# Patient Record
Sex: Female | Born: 1945 | Race: White | Hispanic: No | Marital: Married | Smoking: Never smoker
Health system: Southern US, Community
[De-identification: ages and names within clinical notes are randomized; demographics above are authoritative.]

## PROBLEM LIST (undated history)

## (undated) DIAGNOSIS — IMO0001 Reserved for inherently not codable concepts without codable children: Secondary | ICD-10-CM

## (undated) DIAGNOSIS — I10 Essential (primary) hypertension: Secondary | ICD-10-CM

## (undated) DIAGNOSIS — R159 Full incontinence of feces: Secondary | ICD-10-CM

## (undated) DIAGNOSIS — E119 Type 2 diabetes mellitus without complications: Secondary | ICD-10-CM

## (undated) DIAGNOSIS — N289 Disorder of kidney and ureter, unspecified: Secondary | ICD-10-CM

## (undated) DIAGNOSIS — F32A Depression, unspecified: Secondary | ICD-10-CM

## (undated) DIAGNOSIS — E669 Obesity, unspecified: Secondary | ICD-10-CM

## (undated) DIAGNOSIS — K7581 Nonalcoholic steatohepatitis (NASH): Secondary | ICD-10-CM

## (undated) DIAGNOSIS — F329 Major depressive disorder, single episode, unspecified: Secondary | ICD-10-CM

## (undated) DIAGNOSIS — N952 Postmenopausal atrophic vaginitis: Secondary | ICD-10-CM

## (undated) DIAGNOSIS — E785 Hyperlipidemia, unspecified: Secondary | ICD-10-CM

## (undated) DIAGNOSIS — R3129 Other microscopic hematuria: Secondary | ICD-10-CM

## (undated) DIAGNOSIS — A498 Other bacterial infections of unspecified site: Secondary | ICD-10-CM

## (undated) DIAGNOSIS — R32 Unspecified urinary incontinence: Secondary | ICD-10-CM

## (undated) DIAGNOSIS — F250 Schizoaffective disorder, bipolar type: Secondary | ICD-10-CM

## (undated) DIAGNOSIS — R102 Pelvic and perineal pain: Secondary | ICD-10-CM

## (undated) DIAGNOSIS — F419 Anxiety disorder, unspecified: Secondary | ICD-10-CM

## (undated) DIAGNOSIS — B379 Candidiasis, unspecified: Secondary | ICD-10-CM

## (undated) DIAGNOSIS — K219 Gastro-esophageal reflux disease without esophagitis: Secondary | ICD-10-CM

## (undated) HISTORY — DX: Other microscopic hematuria: R31.29

## (undated) HISTORY — DX: Depression, unspecified: F32.A

## (undated) HISTORY — DX: Schizoaffective disorder, bipolar type: F25.0

## (undated) HISTORY — DX: Nonalcoholic steatohepatitis (NASH): K75.81

## (undated) HISTORY — PX: TOTAL ABDOMINAL HYSTERECTOMY: SHX209

## (undated) HISTORY — DX: Full incontinence of feces: R15.9

## (undated) HISTORY — DX: Gastro-esophageal reflux disease without esophagitis: K21.9

## (undated) HISTORY — DX: Hyperlipidemia, unspecified: E78.5

## (undated) HISTORY — DX: Reserved for inherently not codable concepts without codable children: IMO0001

## (undated) HISTORY — DX: Postmenopausal atrophic vaginitis: N95.2

## (undated) HISTORY — DX: Type 2 diabetes mellitus without complications: E11.9

## (undated) HISTORY — DX: Pelvic and perineal pain: R10.2

## (undated) HISTORY — DX: Major depressive disorder, single episode, unspecified: F32.9

## (undated) HISTORY — DX: Disorder of kidney and ureter, unspecified: N28.9

## (undated) HISTORY — DX: Candidiasis, unspecified: B37.9

## (undated) HISTORY — DX: Other bacterial infections of unspecified site: A49.8

## (undated) HISTORY — PX: INCONTINENCE SURGERY: SHX676

## (undated) HISTORY — PX: CHOLECYSTECTOMY: SHX55

## (undated) HISTORY — DX: Essential (primary) hypertension: I10

## (undated) HISTORY — DX: Anxiety disorder, unspecified: F41.9

## (undated) HISTORY — DX: Obesity, unspecified: E66.9

## (undated) HISTORY — DX: Unspecified urinary incontinence: R32

## (undated) HISTORY — PX: EYE SURGERY: SHX253

---

## 2001-03-11 HISTORY — PX: COLECTOMY: SHX59

## 2011-03-14 LAB — HM COLONOSCOPY: HM Colonoscopy: NORMAL

## 2012-05-03 ENCOUNTER — Observation Stay: Payer: Self-pay | Admitting: Internal Medicine

## 2012-05-03 LAB — CK TOTAL AND CKMB (NOT AT ARMC)
CK, Total: 61 U/L (ref 21–215)
CK-MB: <0.5 ng/mL — ABNORMAL LOW (ref 0.5–3.6)

## 2012-05-03 LAB — APTT: Activated PTT: 31.5 secs (ref 23.6–35.9)

## 2012-05-03 LAB — PROTIME-INR
INR: 0.9
Prothrombin Time: 12.2 secs (ref 11.5–14.7)

## 2012-05-03 LAB — COMPREHENSIVE METABOLIC PANEL
Albumin: 3.7 g/dL (ref 3.4–5.0)
Alkaline Phosphatase: 67 U/L (ref 50–136)
Anion Gap: 5 — ABNORMAL LOW (ref 7–16)
BUN: 12 mg/dL (ref 7–18)
Bilirubin,Total: 0.3 mg/dL (ref 0.2–1.0)
Creatinine: 1.64 mg/dL — ABNORMAL HIGH (ref 0.60–1.30)
EGFR (African American): 38 — ABNORMAL LOW
EGFR (Non-African Amer.): 33 — ABNORMAL LOW
Osmolality: 275 (ref 275–301)
SGOT(AST): 20 U/L (ref 15–37)
SGPT (ALT): 17 U/L (ref 12–78)
Sodium: 137 mmol/L (ref 136–145)

## 2012-05-03 LAB — CBC
HCT: 37.6 % (ref 35.0–47.0)
MCHC: 33.6 g/dL (ref 32.0–36.0)
RDW: 13.6 % (ref 11.5–14.5)
WBC: 9.2 10*3/uL (ref 3.6–11.0)

## 2012-05-03 LAB — URINALYSIS, COMPLETE
Bilirubin,UR: NEGATIVE
Glucose,UR: NEGATIVE mg/dL (ref 0–75)
Ketone: NEGATIVE
Nitrite: NEGATIVE
Ph: 5 (ref 4.5–8.0)
Protein: NEGATIVE
RBC,UR: 5 /HPF (ref 0–5)
Specific Gravity: 1.015 (ref 1.003–1.030)
WBC UR: 5 /HPF (ref 0–5)

## 2012-05-04 LAB — BASIC METABOLIC PANEL
Anion Gap: 5 — ABNORMAL LOW (ref 7–16)
BUN: 9 mg/dL (ref 7–18)
Co2: 25 mmol/L (ref 21–32)
Creatinine: 1.45 mg/dL — ABNORMAL HIGH (ref 0.60–1.30)
EGFR (African American): 44 — ABNORMAL LOW
Glucose: 74 mg/dL (ref 65–99)
Osmolality: 277 (ref 275–301)
Potassium: 4.4 mmol/L (ref 3.5–5.1)
Sodium: 140 mmol/L (ref 136–145)

## 2012-05-04 LAB — CBC WITH DIFFERENTIAL/PLATELET
Basophil #: 0 10*3/uL (ref 0.0–0.1)
Basophil %: 0.3 %
Eosinophil %: 1.4 %
HGB: 10.8 g/dL — ABNORMAL LOW (ref 12.0–16.0)
Lymphocyte #: 1.5 10*3/uL (ref 1.0–3.6)
Neutrophil #: 2.9 10*3/uL (ref 1.4–6.5)
Neutrophil %: 60.6 %
Platelet: 99 10*3/uL — ABNORMAL LOW (ref 150–440)
RBC: 3.77 10*6/uL — ABNORMAL LOW (ref 3.80–5.20)
RDW: 13.3 % (ref 11.5–14.5)
WBC: 4.8 10*3/uL (ref 3.6–11.0)

## 2012-05-04 LAB — CK TOTAL AND CKMB (NOT AT ARMC)
CK, Total: 81 U/L (ref 21–215)
CK-MB: <0.5 ng/mL — ABNORMAL LOW (ref 0.5–3.6)

## 2012-05-04 LAB — TROPONIN I: Troponin-I: <0.02 ng/mL

## 2012-05-09 ENCOUNTER — Ambulatory Visit: Payer: Self-pay | Admitting: Hematology and Oncology

## 2012-05-22 LAB — HM MAMMOGRAPHY: HM Mammogram: NORMAL

## 2012-05-25 LAB — CBC CANCER CENTER
Basophil #: 0 x10 3/mm (ref 0.0–0.1)
Eosinophil #: 0 x10 3/mm (ref 0.0–0.7)
Eosinophil %: 0.6 %
HCT: 33.8 % — ABNORMAL LOW (ref 35.0–47.0)
HGB: 11 g/dL — ABNORMAL LOW (ref 12.0–16.0)
Lymphocyte #: 0.9 x10 3/mm — ABNORMAL LOW (ref 1.0–3.6)
MCH: 28.5 pg (ref 26.0–34.0)
MCV: 88 fL (ref 80–100)
Monocyte #: 0.2 x10 3/mm (ref 0.2–0.9)
Neutrophil %: 70.5 %
Platelet: 114 x10 3/mm — ABNORMAL LOW (ref 150–440)

## 2012-05-26 LAB — FERRITIN: Ferritin (ARMC): 11 ng/mL (ref 8–388)

## 2012-05-26 LAB — IRON AND TIBC
Iron Saturation: 10 %
Iron: 49 ug/dL — ABNORMAL LOW (ref 50–170)
Unbound Iron-Bind.Cap.: 432 ug/dL

## 2012-05-26 LAB — TSH: Thyroid Stimulating Horm: 4.83 u[IU]/mL — ABNORMAL HIGH

## 2012-06-08 LAB — CBC CANCER CENTER
Basophil #: 0 x10 3/mm (ref 0.0–0.1)
Eosinophil #: 0.1 x10 3/mm (ref 0.0–0.7)
HGB: 11.6 g/dL — ABNORMAL LOW (ref 12.0–16.0)
MCH: 29.1 pg (ref 26.0–34.0)
MCHC: 32.6 g/dL (ref 32.0–36.0)
MCV: 89 fL (ref 80–100)
Neutrophil %: 71.3 %
RBC: 3.99 10*6/uL (ref 3.80–5.20)
RDW: 13.9 % (ref 11.5–14.5)
WBC: 4 x10 3/mm (ref 3.6–11.0)

## 2012-06-08 LAB — IRON AND TIBC
Iron Bind.Cap.(Total): 453 ug/dL — ABNORMAL HIGH (ref 250–450)
Iron Saturation: 92 %
Iron: 417 ug/dL — ABNORMAL HIGH (ref 50–170)

## 2012-06-09 ENCOUNTER — Ambulatory Visit: Payer: Self-pay | Admitting: Hematology and Oncology

## 2012-06-11 LAB — CBC CANCER CENTER
Basophil %: 0.5 %
Eosinophil #: 0.1 x10 3/mm (ref 0.0–0.7)
Lymphocyte #: 0.9 x10 3/mm — ABNORMAL LOW (ref 1.0–3.6)
Lymphocyte %: 14.3 %
MCH: 29.5 pg (ref 26.0–34.0)
MCHC: 33.2 g/dL (ref 32.0–36.0)
Monocyte #: 0.3 x10 3/mm (ref 0.2–0.9)
Monocyte %: 5.6 %
Neutrophil #: 4.9 x10 3/mm (ref 1.4–6.5)
RBC: 3.96 10*6/uL (ref 3.80–5.20)
RDW: 14.5 % (ref 11.5–14.5)

## 2012-06-22 LAB — CBC CANCER CENTER
Basophil %: 0.8 %
HGB: 11.6 g/dL — ABNORMAL LOW (ref 12.0–16.0)
MCH: 29.2 pg (ref 26.0–34.0)
MCHC: 32.3 g/dL (ref 32.0–36.0)
Neutrophil %: 68.1 %
Platelet: 90 x10 3/mm — ABNORMAL LOW (ref 150–440)
RBC: 3.98 10*6/uL (ref 3.80–5.20)
RDW: 16 % — ABNORMAL HIGH (ref 11.5–14.5)

## 2012-06-22 LAB — BASIC METABOLIC PANEL
Anion Gap: 7 (ref 7–16)
Chloride: 104 mmol/L (ref 98–107)
Co2: 29 mmol/L (ref 21–32)
EGFR (African American): 41 — ABNORMAL LOW
Glucose: 96 mg/dL (ref 65–99)
Osmolality: 281 (ref 275–301)
Potassium: 4.5 mmol/L (ref 3.5–5.1)

## 2012-06-25 DIAGNOSIS — F319 Bipolar disorder, unspecified: Secondary | ICD-10-CM | POA: Insufficient documentation

## 2012-07-09 ENCOUNTER — Ambulatory Visit: Payer: Self-pay | Admitting: Hematology and Oncology

## 2012-08-24 ENCOUNTER — Ambulatory Visit: Payer: Self-pay | Admitting: Hematology and Oncology

## 2012-08-31 ENCOUNTER — Ambulatory Visit: Payer: Self-pay | Admitting: Hematology and Oncology

## 2012-08-31 LAB — CBC CANCER CENTER
Basophil #: 0.1 x10 3/mm (ref 0.0–0.1)
Basophil %: 1.1 %
Eosinophil %: 2 %
HCT: 38.7 % (ref 35.0–47.0)
HGB: 13.2 g/dL (ref 12.0–16.0)
Lymphocyte %: 26.6 %
MCH: 30.8 pg (ref 26.0–34.0)
MCV: 90 fL (ref 80–100)
Monocyte #: 0.4 x10 3/mm (ref 0.2–0.9)
Neutrophil #: 2.9 x10 3/mm (ref 1.4–6.5)
Neutrophil %: 62.2 %
Platelet: 115 x10 3/mm — ABNORMAL LOW (ref 150–440)
RDW: 12.9 % (ref 11.5–14.5)
WBC: 4.6 x10 3/mm (ref 3.6–11.0)

## 2012-08-31 LAB — RETICULOCYTES
Absolute Retic Count: 0.04 10*6/uL
Reticulocyte: 0.95 %

## 2012-08-31 LAB — IRON AND TIBC
Iron Saturation: 33 %
Iron: 94 ug/dL (ref 50–170)

## 2012-08-31 LAB — FERRITIN: Ferritin (ARMC): 215 ng/mL (ref 8–388)

## 2012-09-08 ENCOUNTER — Ambulatory Visit: Payer: Self-pay | Admitting: Hematology and Oncology

## 2012-11-07 LAB — BASIC METABOLIC PANEL
BUN: 9 mg/dL (ref 7–18)
Calcium, Total: 9.2 mg/dL (ref 8.5–10.1)
Chloride: 103 mmol/L (ref 98–107)
Co2: 29 mmol/L (ref 21–32)
Creatinine: 1.58 mg/dL — ABNORMAL HIGH (ref 0.60–1.30)
EGFR (African American): 39 — ABNORMAL LOW
Glucose: 94 mg/dL (ref 65–99)
Potassium: 3.6 mmol/L (ref 3.5–5.1)
Sodium: 137 mmol/L (ref 136–145)

## 2012-11-07 LAB — URINALYSIS, COMPLETE
Bacteria: NONE SEEN
Bilirubin,UR: NEGATIVE
Glucose,UR: NEGATIVE mg/dL (ref 0–75)
Ph: 6 (ref 4.5–8.0)
Specific Gravity: 1.008 (ref 1.003–1.030)
Squamous Epithelial: 1

## 2012-11-07 LAB — CBC
HCT: 37.6 % (ref 35.0–47.0)
MCHC: 34.7 g/dL (ref 32.0–36.0)
RDW: 12.5 % (ref 11.5–14.5)

## 2012-11-07 LAB — HEPATIC FUNCTION PANEL A (ARMC)
Alkaline Phosphatase: 71 U/L (ref 50–136)
Bilirubin, Direct: <0.1 mg/dL (ref 0.00–0.20)
Bilirubin,Total: 0.5 mg/dL (ref 0.2–1.0)
SGOT(AST): 25 U/L (ref 15–37)
SGPT (ALT): 23 U/L (ref 12–78)

## 2012-11-07 LAB — CK TOTAL AND CKMB (NOT AT ARMC)
CK, Total: 64 U/L (ref 21–215)
CK-MB: <0.5 ng/mL — ABNORMAL LOW (ref 0.5–3.6)

## 2012-11-07 LAB — LIPASE, BLOOD: Lipase: 131 U/L (ref 73–393)

## 2012-11-08 ENCOUNTER — Observation Stay: Payer: Self-pay | Admitting: Internal Medicine

## 2012-11-08 LAB — TROPONIN I: Troponin-I: <0.02 ng/mL

## 2012-11-08 LAB — CK TOTAL AND CKMB (NOT AT ARMC): CK, Total: 112 U/L (ref 21–215)

## 2012-11-09 ENCOUNTER — Ambulatory Visit: Payer: Self-pay | Admitting: Hematology and Oncology

## 2012-11-09 LAB — LIPID PANEL: Cholesterol: 217 mg/dL — ABNORMAL HIGH (ref 0–200)

## 2012-11-16 ENCOUNTER — Ambulatory Visit: Payer: Self-pay | Admitting: Hematology and Oncology

## 2012-11-16 LAB — CBC CANCER CENTER
Basophil #: 0 x10 3/mm (ref 0.0–0.1)
Eosinophil #: 0.1 x10 3/mm (ref 0.0–0.7)
HCT: 42.5 % (ref 35.0–47.0)
Lymphocyte #: 1.1 x10 3/mm (ref 1.0–3.6)
Monocyte #: 0.3 x10 3/mm (ref 0.2–0.9)
Monocyte %: 6.1 %
Neutrophil #: 3.3 x10 3/mm (ref 1.4–6.5)
Neutrophil %: 67.8 %
RDW: 12.7 % (ref 11.5–14.5)

## 2012-11-16 LAB — IRON AND TIBC
Iron Bind.Cap.(Total): 343 ug/dL
Iron Saturation: 31 %
Iron: 108 ug/dL
Unbound Iron-Bind.Cap.: 235 ug/dL

## 2012-11-30 DIAGNOSIS — K7460 Unspecified cirrhosis of liver: Secondary | ICD-10-CM | POA: Insufficient documentation

## 2012-12-09 ENCOUNTER — Ambulatory Visit: Payer: Self-pay | Admitting: Hematology and Oncology

## 2013-01-21 ENCOUNTER — Encounter: Payer: Self-pay | Admitting: Urology

## 2013-01-22 ENCOUNTER — Ambulatory Visit: Payer: Self-pay | Admitting: Family Medicine

## 2013-02-08 ENCOUNTER — Encounter: Payer: Self-pay | Admitting: Urology

## 2013-02-12 ENCOUNTER — Ambulatory Visit: Payer: Self-pay | Admitting: Family Medicine

## 2013-04-13 ENCOUNTER — Ambulatory Visit: Payer: Self-pay | Admitting: Internal Medicine

## 2013-07-13 LAB — LIPID PANEL
CHOLESTEROL: 244 mg/dL — AB (ref 0–200)
HDL: 57 mg/dL (ref 35–70)
LDL Cholesterol: 154 mg/dL
Triglycerides: 164 mg/dL — AB (ref 40–160)

## 2013-07-13 LAB — BASIC METABOLIC PANEL
BUN: 14 mg/dL (ref 4–21)
CREATININE: 1.4 mg/dL — AB (ref <=1.1)

## 2013-07-13 LAB — TSH: TSH: 3.6 u[IU]/mL (ref <=5.90)

## 2013-07-13 LAB — CBC AND DIFFERENTIAL: Hemoglobin: 13.3 g/dL (ref 12.0–16.0)

## 2014-04-18 DIAGNOSIS — R319 Hematuria, unspecified: Secondary | ICD-10-CM | POA: Diagnosis not present

## 2014-04-18 DIAGNOSIS — N289 Disorder of kidney and ureter, unspecified: Secondary | ICD-10-CM | POA: Diagnosis not present

## 2014-04-18 DIAGNOSIS — R350 Frequency of micturition: Secondary | ICD-10-CM | POA: Diagnosis not present

## 2014-04-18 DIAGNOSIS — N390 Urinary tract infection, site not specified: Secondary | ICD-10-CM | POA: Diagnosis not present

## 2014-04-18 DIAGNOSIS — R102 Pelvic and perineal pain: Secondary | ICD-10-CM | POA: Diagnosis not present

## 2014-04-18 DIAGNOSIS — R312 Other microscopic hematuria: Secondary | ICD-10-CM | POA: Diagnosis not present

## 2014-04-18 DIAGNOSIS — N952 Postmenopausal atrophic vaginitis: Secondary | ICD-10-CM | POA: Diagnosis not present

## 2014-05-05 DIAGNOSIS — E119 Type 2 diabetes mellitus without complications: Secondary | ICD-10-CM | POA: Diagnosis not present

## 2014-05-06 DIAGNOSIS — R102 Pelvic and perineal pain: Secondary | ICD-10-CM | POA: Diagnosis not present

## 2014-05-06 DIAGNOSIS — L909 Atrophic disorder of skin, unspecified: Secondary | ICD-10-CM | POA: Diagnosis not present

## 2014-05-06 DIAGNOSIS — R198 Other specified symptoms and signs involving the digestive system and abdomen: Secondary | ICD-10-CM | POA: Diagnosis not present

## 2014-05-06 DIAGNOSIS — R109 Unspecified abdominal pain: Secondary | ICD-10-CM | POA: Diagnosis not present

## 2014-05-11 DIAGNOSIS — L909 Atrophic disorder of skin, unspecified: Secondary | ICD-10-CM | POA: Diagnosis not present

## 2014-05-11 DIAGNOSIS — R109 Unspecified abdominal pain: Secondary | ICD-10-CM | POA: Diagnosis not present

## 2014-05-16 DIAGNOSIS — R319 Hematuria, unspecified: Secondary | ICD-10-CM | POA: Diagnosis not present

## 2014-05-16 DIAGNOSIS — N183 Chronic kidney disease, stage 3 (moderate): Secondary | ICD-10-CM | POA: Diagnosis not present

## 2014-05-16 DIAGNOSIS — N390 Urinary tract infection, site not specified: Secondary | ICD-10-CM | POA: Diagnosis not present

## 2014-05-23 DIAGNOSIS — I69359 Hemiplegia and hemiparesis following cerebral infarction affecting unspecified side: Secondary | ICD-10-CM | POA: Diagnosis not present

## 2014-05-23 DIAGNOSIS — F324 Major depressive disorder, single episode, in partial remission: Secondary | ICD-10-CM | POA: Diagnosis not present

## 2014-05-23 DIAGNOSIS — S76211A Strain of adductor muscle, fascia and tendon of right thigh, initial encounter: Secondary | ICD-10-CM | POA: Diagnosis not present

## 2014-05-23 DIAGNOSIS — M47816 Spondylosis without myelopathy or radiculopathy, lumbar region: Secondary | ICD-10-CM | POA: Diagnosis not present

## 2014-05-23 DIAGNOSIS — N183 Chronic kidney disease, stage 3 (moderate): Secondary | ICD-10-CM | POA: Diagnosis not present

## 2014-06-01 DIAGNOSIS — N183 Chronic kidney disease, stage 3 (moderate): Secondary | ICD-10-CM | POA: Diagnosis not present

## 2014-06-13 DIAGNOSIS — R350 Frequency of micturition: Secondary | ICD-10-CM | POA: Diagnosis not present

## 2014-06-13 DIAGNOSIS — F339 Major depressive disorder, recurrent, unspecified: Secondary | ICD-10-CM | POA: Diagnosis not present

## 2014-06-13 DIAGNOSIS — E669 Obesity, unspecified: Secondary | ICD-10-CM | POA: Diagnosis not present

## 2014-06-13 DIAGNOSIS — N390 Urinary tract infection, site not specified: Secondary | ICD-10-CM | POA: Diagnosis not present

## 2014-06-13 DIAGNOSIS — R312 Other microscopic hematuria: Secondary | ICD-10-CM | POA: Diagnosis not present

## 2014-06-27 DIAGNOSIS — R198 Other specified symptoms and signs involving the digestive system and abdomen: Secondary | ICD-10-CM | POA: Diagnosis not present

## 2014-06-27 DIAGNOSIS — N949 Unspecified condition associated with female genital organs and menstrual cycle: Secondary | ICD-10-CM | POA: Diagnosis not present

## 2014-06-27 DIAGNOSIS — N941 Dyspareunia: Secondary | ICD-10-CM | POA: Diagnosis not present

## 2014-06-27 DIAGNOSIS — L900 Lichen sclerosus et atrophicus: Secondary | ICD-10-CM | POA: Insufficient documentation

## 2014-06-27 DIAGNOSIS — T859XXA Unspecified complication of internal prosthetic device, implant and graft, initial encounter: Secondary | ICD-10-CM | POA: Insufficient documentation

## 2014-06-27 DIAGNOSIS — M62838 Other muscle spasm: Secondary | ICD-10-CM | POA: Diagnosis not present

## 2014-07-01 DIAGNOSIS — F339 Major depressive disorder, recurrent, unspecified: Secondary | ICD-10-CM | POA: Diagnosis not present

## 2014-07-01 NOTE — Discharge Summary (Signed)
PATIENT NAME:  Hannah Melendez, Hannah Melendez MR#:  [id number] DATE OF BIRTH:  Jan 26, 1946  PRIMARY CARE PHYSICIAN: Dr. Gavin PottersGrandis.   DATE OF ADMISSION: 11/08/2012.   DISCHARGE DIAGNOSES: Atypical chest pain on the right side, generalized weakness, hyperlipidemia, chronic kidney disease, thrombocytopenia.   CONDITION: Stable.   CODE STATUS: FULL CODE.   HOME MEDICATIONS:  1.  Nexium 40 mg p.o. daily.  2.  Plavix 75 mg p.o. daily.  3.  Trazodone 100 mg 2.5 tablets once a day at bedtime.  4.  Fexofenadine 180 mg p.o. daily.    DIET: Low fat, low cholesterol diet.   ACTIVITY: As tolerated.   FOLLOW-UP CARE: Follow with PCP within 1 to 2 weeks.   REASON FOR ADMISSION: Chest pain on the right side.   HOSPITAL COURSE: The patient is a 69 year old Caucasian female with a history of PVD, hyperlipidemia, strong family history of CAD, who presented with right-sided chest pain.   For detailed history and physical examination, please refer to the admission note dictated by Dr. Clint GuyHower.  The patient's EKG was normal. Troponin level was in normal range. The patient was admitted for atypical chest pain on the right side under her breasts which is possibly due to musculoskeletal etiology. The patient has no more chest pain after admission.   Troponin level, three sets, has been is negative.   ASSESSMENT AND PLAN:  1.  Weakness. The patient complaints of generalized weakness. The patient's hemoglobin is normal. No obvious etiology so far. I advised the patient to follow up with PCP for further work-up.  2.  Hyperlipidemia. The patient's lipid panel showed hyperlipidemia with LDL 125. Advised patient  exercise and diet control.  3.  The patient's vital signs are stable. She is clinically stable and will be discharged to home today.   Adviced the patient to follow up with PCP for further work-up for fatigue.   TIME SPENT: About 35 minutes.    ____________________________ Shaune PollackQing Rashell Shambaugh, MD qc:np D: 11/09/2012 14:55:20  ET T: 11/09/2012 16:04:49 ET JOB#: [id number]  cc: Shaune PollackQing Kennth Vanbenschoten, MD, <Dictator> Shaune PollackQING Sora Vrooman MD ELECTRONICALLY SIGNED 11/10/2012 15:01

## 2014-07-01 NOTE — Discharge Summary (Signed)
PATIENT NAME:  Hannah Melendez, Hannah Melendez MR#:  [id number] DATE OF BIRTH:  1945/07/29  DATE OF ADMISSION:  05/03/2012 DATE OF DISCHARGE:  05/05/2012  DISCHARGE DIAGNOSES: 1.  Chest pain, now resolved, with negative cardiac enzymes. Normal echocardiogram and negative Myoview. This is unlikely cardiac in nature.  2.  Acute renal insufficiency, resolved with IV fluids, likely prerenal etiology. 3.  Vertigo and dizziness, could be due to benign positional vertigo, seemed to be improving on meclizine.   SECONDARY DIAGNOSES:  1.  Hypertension. 2.  Hyperlipidemia. 3.  Hyperglycemia.  4.  Peripheral vascular disease with carotid stenosis. 5.  Bipolar disorder.  6.  A history of bowel obstruction.   CONSULTATIONS:  None.   PROCEDURES/RADIOLOGY:  1.  Chest x-ray on February 23rd showed no acute cardiopulmonary disease.  2.  Myoview on February 24th showed EF of 50%. No evidence of ischemia arrhythmia with normal LEXI scan infusion. No evidence of myocardial ischemia.  3.  Lower extremity Dopplers showed no evidence of DVT.  4.  Two-D echocardiogram on February 24th showed normal LV systolic function with EF of 55%. Mild mitral regurgitation.   MAJOR LABORATORY PANEL:  UA on admission was negative.   HISTORY AND SHORT HOSPITAL COURSE:  The patient is a 69 year old female with the above-mentioned medical problems who was admitted for chest pain/pressure associated with shortness of breath and dizziness. Please see Dr. Judithann SheenSparks' dictated history and physical for further details. The patient was ruled out with 3 negative sets of cardiac enzymes. She also underwent Myoview, which was negative. Also a 2-D echo, which was within limits. She was feeling much better by February 25th. She did have some symptoms of dizziness, which was thought to be secondary to benign positional vertigo as she did have some vertigo sensation is also. This was improving with meclizine. She was also found to have acute renal insufficiency  which was thought to be prerenal as this resolved with hydration.   On the date of discharge her vital signs were as follows:  Temperature 98.1, heart rate 71 per minute, respirations 20 per minute, blood pressure 129/78, mmHg. Her orthostatic vitals remained negative. She was saturating 96% on room air.   PERTINENT PHYSICAL EXAMINATION ON THE DATE OF DISCHARGE:  CARDIOVASCULAR: S1, S2 normal. No murmur, rubs, gallop.  LUNGS: Clear to auscultation bilaterally. No wheezing, rales, rhonchi, or crepitation.  ABDOMEN: Soft, benign.  NEUROLOGIC: Nonfocal examination.  All other physical examination remained at baseline.   DISCHARGE MEDICATIONS:  1.  Tramadol 150 mg p.o. b.i.d. as needed.  2.  Abilify 2 mg 2 tablets p.o. daily.  3.  Nexium 40 mg p.o. daily.  4.  Plavix 75 mg p.o. daily. 5.   Lamotrigine 100 mg p.o. b.i.d.  6.  Trazodone 250 mg p.o. at bedtime. 7.  Crestor 40 mg p.o. daily. 8.  Ranitidine 150 mg p.o. daily.  9.  Paroxetine 50 mg p.o. at bedtime.  10.  Meclizine 25 mg p.o. b.i.d.    DISCHARGE DIET:  Regular.   DISCHARGE ACTIVITY:  As tolerated.   DISCHARGE INSTRUCTIONS AND FOLLOW UP:  The patient was instructed to follow up with her primary care physician, Dr. Gavin PottersGrandis in 1 to 2 weeks. She will need follow up with [facility] in 1 week if not feeling better. She was set up to get Home Health, Physical Therapy, and Nursing.       TOTAL TIME DISCHARGING THIS PATIENT:  55 minutes.   ____________________________ Ellamae SiaVipul S. Sherryll BurgerShah, MD vss:jm D:  05/09/2012 15:01:27 ET T: 05/09/2012 15:32:58 ET JOB#: [id number]  cc: Kylo Gavin S. Sherryll Burger, MD, <Dictator> Letitia Caul, MD [facility] Ellamae Sia [facility] MD ELECTRONICALLY SIGNED 05/10/2012 12:09

## 2014-07-01 NOTE — H&P (Signed)
PATIENT NAME:  Hannah Melendez, Hannah Melendez MR#:  [id number] DATE OF BIRTH:  01-21-1946  DATE OF ADMISSION:  05/03/2012  REFERRING PHYSICIAN: Dr. Arnoldo MoraleManley in the Emergency Room.   FAMILY PHYSICIAN: Dr. Gavin PottersGrandis.   REASON FOR ADMISSION: Chest pain with shortness of breath and dizziness.   HISTORY OF PRESENT ILLNESS: The patient is a 69 year old female with a history of peripheral vascular disease, bipolar disorder, hypertension and hyperlipidemia, who has been having a 2-week history of progressive shortness of breath with pressure in her upper chest radiating to the left arm. She was seen as an outpatient by Dr. Gwen PoundsKowalski and was set up for an echocardiogram, which has not been done yet. She presents to the Emergency Room with dizziness as well as worsening shortness of breath as well as the chest pressure radiating to the left arm. In the Emergency Room, the patient's EKG was nonacute and her initial cardiac enzymes were negative. She is now admitted for further evaluation.   PAST MEDICAL HISTORY: 1.  Benign hypertension, off medications.  2.  Hyperlipidemia.  3.  Hyperglycemia.  4.  Peripheral vascular disease with carotid stenosis.  5.  Bipolar disorder.  6.  History of bowel obstruction, status post partial colectomy.  7.  Status post hysterectomy.  8.  Status post cholecystectomy.   MEDICATIONS: 1.  Trazodone 250 mg p.o. at bedtime.  2.  Tramadol 50 mg 3 tablets p.o. b.i.d. 3.  Zantac 150 mg p.o. daily.  4.  Paxil CR 50 mg p.o. daily. 5.  Nexium 40 mg p.o. daily.  6.  Lamictal 100 mg p.o. b.i.d.  7.  Crestor 40 mg p.o. daily.  8.  Plavix 75 mg p.o. daily.  9.  Abilify 4 mg p.o. daily.   ALLERGIES: PROPOXYPHENE AND MORPHINE.   SOCIAL HISTORY: The patient does smoke. Denies alcohol abuse. She is married.   FAMILY HISTORY: Positive for diabetes, hypertension and coronary artery disease.   REVIEW OF SYSTEMS: CONSTITUTIONAL: No fever or change in weight.  EYES: No blurred or double vision. No  glaucoma.  ENT: No tinnitus or hearing loss. No nasal discharge or bleeding. No difficulty swallowing.  RESPIRATORY: No cough or wheezing. Denies hemoptysis. No painful respiration.  CARDIOVASCULAR: No orthopnea or palpitations. No syncope.  GI: No nausea, vomiting or diarrhea. No abdominal pain. No change in bowel habits.  GU: No dysuria or hematuria. No incontinence.  ENDOCRINE: No polyuria or polydipsia. No heat or cold intolerance.  HEMATOLOGIC: The patient denies anemia, easy bruising or bleeding.  LYMPHATIC: No swollen glands.  MUSCULOSKELETAL: The patient denies pain in her neck, back, shoulders, knees, hips. No gout.  NEUROLOGIC: No numbness or migraines. Denies stroke or seizures.  PSYCH: The patient denies anxiety or insomnia, but does admit to bipolar depression.   PHYSICAL EXAMINATION: GENERAL: The patient is obese, in no acute distress.  VITAL SIGNS: Currently remarkable for a blood pressure of 111/60 with a heart rate of 84 and a respiratory rate of 18. She is afebrile.  HEENT: Normocephalic, atraumatic. The pupils are equally round and reactive to light and accommodation. Extraocular movements are intact. Sclerae anicteric. Conjunctivae are clear.  OROPHARYNX: Clear.  NECK: Supple without JVD. Soft bilateral carotid bruits are noted. No adenopathy or thyromegaly present.  LUNGS: Clear to auscultation and percussion without wheezes, rales or rhonchi. No dullness.  CARDIAC: Regular rate and rhythm with normal S1 and S2. No significant rubs, murmurs or gallops. PMI is nondisplaced. Chest wall is nontender.   ABDOMEN: Soft, nontender with normoactive  bowel sounds. No organomegaly or masses were appreciated. No hernias or bruits were noted.  EXTREMITIES: Without clubbing, cyanosis, edema. Pulses are 2+ bilaterally.  SKIN: Warm and dry without rash or lesions.  NEUROLOGIC: Cranial nerves II through XII grossly intact. Deep tendon reflexes were symmetric. Motor and sensory exam is  nonfocal.  PSYCHIATRIC: The patient was alert and oriented to person, place and time. She was cooperative and used good judgment.   LABORATORY DATA: EKG revealed sinus rhythm with no acute ischemic changes.   Chest x-ray was unremarkable.   Her glucose was 133 with a BUN of 12 and creatinine 1.64 with a sodium of 137 and a potassium of 4.3 with a GFR of 33. Total CK was 61 with an MB of less than 0.5. White count was 9.2 with a hemoglobin of 12.6.   ASSESSMENT: 1.  Chest pain with dyspnea, worrisome for unstable angina.  2.  Hyperglycemia.  3.  Renal insufficiency.  4.  Peripheral vascular disease.  5.  Vertigo/dizziness.  6.  Benign hypertension, stable.  7.  Hyperlipidemia.   PLAN: The patient will be observed on telemetry with continuation of Plavix. We will follow serial cardiac enzymes and obtain an echocardiogram. Assuming her enzymes remain negative, we will proceed with stress Myoview testing in the morning. We will begin meclizine for her dizziness. We will begin IV fluids and follow up her renal function in the morning. We will continue her bipolar medications for now. Further treatment and evaluation will depend upon the patient's progress.   Total Time Spent lung patient: 45 minutes.     ____________________________ Duane Lope Judithann Sheen, MD jds:cc D: 05/03/2012 16:28:44 ET T: 05/03/2012 16:57:03 ET JOB#: [id number]  cc: Duane Lope. Judithann Sheen, MD, <Dictator> Letitia Caul, MD Brycin Kille Rodena Medin MD ELECTRONICALLY SIGNED 05/04/2012 8:01

## 2014-07-01 NOTE — H&P (Signed)
PATIENT NAME:  Hannah Melendez, Hannah Melendez MR#:  [id number] DATE OF BIRTH:  Feb 27, 1946  DATE OF ADMISSION:  11/08/2012  PRIMARY CARE PHYSICIAN:  Dr. Gavin PottersGrandis.   REFERRING PHYSICIAN:  Dr. Lenard LancePaduchowski.   HISTORY OF PRESENT ILLNESS:  Hannah Melendez is a 69 year old Caucasian female with past medical history significant for peripheral vascular disease, hyperlipidemia, strong family history for coronary artery disease who is presenting with right-sided chest pain occurring originally at 5:00 p.m. the day of admission.  She describes the pain as 10 out of 10 in intensity, under her right breast, sharp and grabbing in quality and without radiation.  She says it was somewhat worsened by activity, but noticed no relieving factors.  The chest pain episodes lasted a few seconds, but occurred up to 5 times.  She had associated shortness of breath as well as diaphoresis.  By the time she arrived to the Emergency Department all of her symptoms had resolved.  As far as her cardiac history is concerned she underwent a cardiac work-up in February 2014 including a stress test and echocardiogram which were normal and revealed a good ejection fraction of 57%.  She saw Dr. Gwen PoundsKowalski as an outpatient from cardiology in the past.  Aside from the symptoms as above, she also complains of having recurrent-like spasms for many weeks mostly at night and they affect her sleep.  They are crampy and burning in nature.  No worsening or relieving factors.  No daytime symptoms.  Also, on presentation to the Emergency Department she noticed that her left eye mildly painful, red and irritated.  She denies any visual problems.  She states that she was outside earlier and potentially got some dust or other foreign body.  In the Emergency Department thus far EKG was normal sinus rhythm.  First set of troponins negative.   REVIEW OF SYSTEMS:   CONSTITUTIONAL:  Denies fevers, chills, weight changes.  EYES:  As above.  EARS, NOSE, THROAT, MOUTH:  No oral lesions,  ulcers.  CARDIOVASCULAR:  Denies any palpitations.  Chest pain as described above.  RESPIRATORY:  Shortness of breath as described above.  She denies any dyspnea on exertion or cough.  GASTROINTESTINAL:  No nausea, vomiting, diarrhea, constipation.  GENITOURINARY:  No dysuria or increased urinary frequency.  MUSCULOSKELETAL:  Leg cramping as described above, otherwise no pain or weakness.  SKIN:  No rashes or lesions.  NEUROLOGIC:  No headache, vertigo, paresthesias, paralysis.  ENDOCRINE:  No heat or cold intolerance, thyroid problems.  HEMATOLOGY AND LYMPHATIC:  No bleeding or easy bruisability.  ALLERGY AND IMMUNOLOGY:  No active symptoms.  Otherwise full review of systems performed by me is negative.   PAST MEDICAL HISTORY:  Benign hypertension off all medications, hyperlipidemia, peripheral vascular disease, history of a bowel obstruction status post partial colectomy about 15 years ago, post hysterectomy and post cholecystectomy.   SOCIAL HISTORY:  Denies any alcohol or tobacco usage.  Lives with her husband.   FAMILY HISTORY:  Significant for cardiovascular disease including coronary artery disease many family members of early onset less than 652 years of age.   ALLERGIES:  MORPHINE AND PROPOXYPHENE.   HOME MEDICATIONS:  Plavix 75 mg by mouth daily, fexofenadine 180 mg by mouth daily, Nexium 40 mg by mouth daily, tramadol 50 mg 3 tabs by mouth twice daily as needed for pain, trazodone 100 mg 2.5 tabs by mouth at bedtime.   PHYSICAL EXAMINATION:  VITAL SIGNS:  Temperature 98.6, heart rate 63, respirations 18, blood pressure 104/59, saturating 96%  on room air.  GENERAL:  No acute distress.  Awake, alert and oriented x 3.  HEENT:  Normocephalic, atraumatic.  Extraocular muscles intact.  Pupils equal, round and reactive to light as well as accommodation.  There is conjunctival erythema on the left side and visual acuity is not affected.  No mucosal lesions.  CARDIOVASCULAR:  S1 and S2,  regular rate and rhythm.  No murmurs, rubs or gallops.  Chest pain, not reproducible on palpation.  PULMONARY:  Clear to auscultation bilaterally without wheezes, rubs or rhonchi.  ABDOMEN:  Soft, nontender, nondistended with positive bowel sounds.  EXTREMITIES:  Reveal no cyanosis, edema or clubbing.  NEUROLOGIC:  Cranial nerves II through XII intact.  No gross neurological deficits.   LABORATORY DATA:  Sodium 137, potassium 3.6, chloride 103, bicarb 29, BUN 9, creatinine 1.58, compared to her most recent baseline of 1.54 back in April of this year and 1.5 appears to be her baseline as far documented as February.  CK-MB less than 0.5, troponin I less than 0.02, WBC 4.9, hemoglobin 13, platelets 88, compared to a baseline of about 100.  Urinalysis, 2+ blood, 3+ RBC, 3+ WBC.  Chest x-ray, no acute cardiopulmonary process.  Final reading pending.  CT of the abdomen performed revealing no acute findings.  EKG, normal sinus rhythm, heart rate 72.  No ST or T abnormalities.   ASSESSMENT AND PLAN:  A 69 year old Caucasian female with past medical history of hyperlipidemia, peripheral vascular disease, strong family history of coronary artery disease presenting with atypical chest pain as well as symptoms of restless leg syndrome and left conjunctiva erythema.  As far as the chest pain in the ED initial EKG and enzymes were within normal limits.   1.  Atypical chest pain.  Given her age, family history and peripheral vascular disease she is being admitted for observation on cardiac telemetry.  Cardiac enzymes trended x 3.  She recently had a stress and echo back in February of this year and it was normal.  Therefore no indication to repeat these at this time.  2.  Restless leg syndrome.  She is not anemic, but we will check a serum ferritin level and provide low dose pramipexole.  3.  Left conjunctival irritation.  States this is like whenever she developed a stye in the past.  We will provide warm compresses as  well as saline eye drops.   4.  Gastroesophageal reflux disease.  We will continue Nexium.  5.  Peripheral vascular disease.  Continue Plavix.  6.  The patient is FULL CODE.   Total time spent 35 minutes.     ____________________________ Cletis Athens. Hower, MD dkh:ea D: 11/08/2012 01:14:15 ET T: 11/08/2012 02:58:08 ET JOB#: [id number]  cc: Cletis Athens. Hower, MD, <Dictator> DAVID Synetta Shadow MD ELECTRONICALLY SIGNED 11/08/2012 3:47

## 2014-07-20 DIAGNOSIS — M47816 Spondylosis without myelopathy or radiculopathy, lumbar region: Secondary | ICD-10-CM | POA: Diagnosis not present

## 2014-07-20 DIAGNOSIS — K259 Gastric ulcer, unspecified as acute or chronic, without hemorrhage or perforation: Secondary | ICD-10-CM | POA: Diagnosis not present

## 2014-07-20 DIAGNOSIS — F339 Major depressive disorder, recurrent, unspecified: Secondary | ICD-10-CM | POA: Diagnosis not present

## 2014-07-20 DIAGNOSIS — I6930 Unspecified sequelae of cerebral infarction: Secondary | ICD-10-CM | POA: Diagnosis not present

## 2014-07-20 DIAGNOSIS — I69359 Hemiplegia and hemiparesis following cerebral infarction affecting unspecified side: Secondary | ICD-10-CM | POA: Diagnosis not present

## 2014-07-22 ENCOUNTER — Encounter: Payer: Self-pay | Admitting: Internal Medicine

## 2014-07-22 DIAGNOSIS — E7849 Other hyperlipidemia: Secondary | ICD-10-CM | POA: Insufficient documentation

## 2014-07-22 DIAGNOSIS — G2581 Restless legs syndrome: Secondary | ICD-10-CM | POA: Insufficient documentation

## 2014-07-22 DIAGNOSIS — N289 Disorder of kidney and ureter, unspecified: Secondary | ICD-10-CM | POA: Insufficient documentation

## 2014-07-22 DIAGNOSIS — I6990 Unspecified sequelae of unspecified cerebrovascular disease: Secondary | ICD-10-CM | POA: Insufficient documentation

## 2014-07-22 DIAGNOSIS — J309 Allergic rhinitis, unspecified: Secondary | ICD-10-CM | POA: Insufficient documentation

## 2014-07-22 DIAGNOSIS — N952 Postmenopausal atrophic vaginitis: Secondary | ICD-10-CM | POA: Insufficient documentation

## 2014-07-22 DIAGNOSIS — F339 Major depressive disorder, recurrent, unspecified: Secondary | ICD-10-CM | POA: Insufficient documentation

## 2014-07-22 DIAGNOSIS — G4700 Insomnia, unspecified: Secondary | ICD-10-CM | POA: Insufficient documentation

## 2014-07-22 DIAGNOSIS — M47816 Spondylosis without myelopathy or radiculopathy, lumbar region: Secondary | ICD-10-CM | POA: Insufficient documentation

## 2014-07-22 DIAGNOSIS — K5909 Other constipation: Secondary | ICD-10-CM | POA: Insufficient documentation

## 2014-07-22 DIAGNOSIS — M159 Polyosteoarthritis, unspecified: Secondary | ICD-10-CM | POA: Insufficient documentation

## 2014-07-22 DIAGNOSIS — I6930 Unspecified sequelae of cerebral infarction: Secondary | ICD-10-CM | POA: Insufficient documentation

## 2014-07-22 DIAGNOSIS — Q522 Congenital rectovaginal fistula: Secondary | ICD-10-CM | POA: Insufficient documentation

## 2014-07-22 DIAGNOSIS — K259 Gastric ulcer, unspecified as acute or chronic, without hemorrhage or perforation: Secondary | ICD-10-CM | POA: Insufficient documentation

## 2014-07-22 DIAGNOSIS — R3129 Other microscopic hematuria: Secondary | ICD-10-CM | POA: Insufficient documentation

## 2014-08-16 ENCOUNTER — Ambulatory Visit (INDEPENDENT_AMBULATORY_CARE_PROVIDER_SITE_OTHER): Payer: Medicare Other | Admitting: Internal Medicine

## 2014-08-16 ENCOUNTER — Encounter: Payer: Self-pay | Admitting: Internal Medicine

## 2014-08-16 VITALS — BP 110/64 | HR 72 | Ht 62.0 in | Wt 211.2 lb

## 2014-08-16 DIAGNOSIS — H6982 Other specified disorders of Eustachian tube, left ear: Secondary | ICD-10-CM | POA: Diagnosis not present

## 2014-08-16 DIAGNOSIS — R32 Unspecified urinary incontinence: Secondary | ICD-10-CM | POA: Insufficient documentation

## 2014-08-16 DIAGNOSIS — K259 Gastric ulcer, unspecified as acute or chronic, without hemorrhage or perforation: Secondary | ICD-10-CM | POA: Diagnosis not present

## 2014-08-16 DIAGNOSIS — F250 Schizoaffective disorder, bipolar type: Secondary | ICD-10-CM | POA: Insufficient documentation

## 2014-08-16 NOTE — Patient Instructions (Signed)
Begin sudafed 30 mg three times a day for ear congestion - take until relieved.  If symptoms continue past one week, then will send to ENT.

## 2014-08-16 NOTE — Progress Notes (Signed)
Date:  08/16/2014   Name:  Hannah DammeGlenda Tugman   DOB:  [birth year]   MRN:  [id number]   Chief Complaint: Otalgia   History of Present Illness:  This is a 69 y.o. female who is presenting with left ear pain and abdominal pain.   Otalgia  There is pain in the left ear. This is a new problem. The current episode started in the past 7 days. The problem occurs constantly. The problem has been unchanged. There has been no fever. The pain is at a severity of 0/10. The patient is experiencing no pain. Associated symptoms include abdominal pain. Pertinent negatives include no coughing or sore throat. She has tried nothing for the symptoms.  Abdominal Pain This is a recurrent problem. The onset quality is gradual. The problem occurs 2 to 4 times per day. The problem has been unchanged. The pain is located in the RUQ. The quality of the pain is burning. The abdominal pain does not radiate. Associated symptoms include constipation. Pertinent negatives include no fever or nausea. She has tried proton pump inhibitors for the symptoms. Her past medical history is significant for GERD and PUD.     Review of Systems:  Review of Systems  Constitutional: Negative for fever and chills.  HENT: Positive for ear pain. Negative for postnasal drip, sinus pressure, sore throat and trouble swallowing.        Decreased hearing in left ear  Respiratory: Negative for cough and shortness of breath.   Cardiovascular: Negative for chest pain.  Gastrointestinal: Positive for abdominal pain and constipation. Negative for nausea.  Psychiatric/Behavioral: Negative for dysphoric mood and agitation.    Patient Active Problem List   Diagnosis Date Noted  . Allergic rhinitis 07/22/2014  . Atrophic vaginitis 07/22/2014  . Impaired renal function 07/22/2014  . Chronic constipation 07/22/2014  . Recurrent major depressive episodes 07/22/2014  . 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 Hematuria, microscopic 07/22/2014  .  History of cerebrovascular accident with residual deficit 07/22/2014  . Familial multiple lipoprotein-type hyperlipidemia 07/22/2014  . Insomnia, persistent 07/22/2014  . Degenerative arthritis of lumbar spine 07/22/2014  . Generalized OA 07/22/2014  . Gastric peptic ulcer 07/22/2014  . Restless leg 07/22/2014  . Cerebrovascular accident, late effects 07/22/2014  . Lichen sclerosus 06/27/2014    Prior to Admission medications   Medication Sig Start Date End Date Taking? Authorizing Provider  baclofen (LIORESAL) 10 MG tablet Take 0.5 tablets by mouth daily. 05/25/14 05/25/15 Yes Historical Provider, MD  celecoxib (CELEBREX) 200 MG capsule Take 1 capsule by mouth daily. 07/20/14  Yes Historical Provider, MD  cetirizine (ZYRTEC) 10 MG tablet Take 1 tablet by mouth daily. 07/20/14  Yes Historical Provider, MD  citalopram (CELEXA) 20 MG tablet Take 1 tablet by mouth daily. 07/01/14  Yes Historical Provider, MD  clobetasol ointment (TEMOVATE) 0.05 % Apply topically.   Yes Historical Provider, MD  Clobetasol Prop Emollient Base 0.05 % emollient cream CLOBETASOL PROPIONATE E, 0.05% (External Cream) - Historical Medication  two times daily (0.05 %) Active   Yes Historical Provider, MD  clonazePAM (KLONOPIN) 0.5 MG tablet Take 1 tablet by mouth daily as needed. 07/20/14  Yes Historical Provider, MD  clopidogrel (PLAVIX) 75 MG tablet Take 1 tablet by mouth daily. 07/20/14  Yes Historical Provider, MD  estradiol (ESTRACE) 0.1 MG/GM vaginal cream Place 1 g vaginally 2 (two) times a week. 05/31/13  Yes Historical Provider, MD  flavoxATE (URISPAS) 100 MG tablet Take 100 mg by mouth. 10/09/13  Yes Historical Provider,  MD  fluticasone (FLONASE) 50 MCG/ACT nasal spray Place 2 sprays into the nose daily. 07/20/14  Yes Historical Provider, MD  mometasone (NASONEX) 50 MCG/ACT nasal spray USE TWO SPRAY EACH NOSTRIL EVERY DAY 04/20/13  Yes Historical Provider, MD  pantoprazole (PROTONIX) 40 MG tablet Take 1 tablet by mouth  daily. 02/11/14  Yes Historical Provider, MD  rOPINIRole (REQUIP) 0.25 MG tablet Take 1 tablet by mouth at bedtime. 02/11/14  Yes Historical Provider, MD  rosuvastatin (CRESTOR) 20 MG tablet Take 1 tablet by mouth at bedtime. 07/20/14  Yes Historical Provider, MD  traMADol (ULTRAM) 50 MG tablet Take 1 tablet by mouth 4 (four) times daily as needed. 07/20/14  Yes Historical Provider, MD  traZODone (DESYREL) 100 MG tablet Take 2.5 tablets by mouth at bedtime. 07/20/14  Yes Historical Provider, MD  gabapentin (NEURONTIN) 300 MG capsule Take 1 capsule by mouth 3 (three) times daily. 07/20/14   Historical Provider, MD  lactulose (CHRONULAC) 10 GM/15ML solution Take 30 mLs by mouth daily as needed. 06/20/14   Historical Provider, MD  VESICARE 10 MG tablet  07/26/14   Historical Provider, MD    Allergies  Allergen Reactions  . Oxycodone Anxiety and Hives  . Propoxyphene Hives and Itching  . Codeine     hallucinations  . Morphine     Other reaction(s): Hallucination Other reaction(s): OTHER  . Nsaids     Other reaction(s): Other (See Comments) H/o ulcers    Past Surgical History  Procedure Laterality Date  . Colectomy  2003    non-malignant bowel obstruction  . Cholecystectomy    . Total abdominal hysterectomy    . Incontinence surgery    . Eye surgery Bilateral     Cataracts    History  Substance Use Topics  . Smoking status: Never Smoker   . Smokeless tobacco: Not on file  . Alcohol Use: No     Medication list has been reviewed and updated.  Physical Examination:  Physical Exam  Constitutional: She is oriented to person, place, and time. She appears well-developed. No distress.  HENT:  Head: Normocephalic and atraumatic.  Right Ear: No drainage. Tympanic membrane is retracted. No middle ear effusion.  Left Ear: No drainage. Tympanic membrane is retracted.  No middle ear effusion. Decreased hearing is noted.  Neck: Normal range of motion. Neck supple.  Cardiovascular: Normal  rate, regular rhythm and normal heart sounds.   Pulmonary/Chest: Effort normal and breath sounds normal. She has no wheezes.  Abdominal: Soft. Bowel sounds are normal. There is tenderness (mild tenderness LUQ).  Lymphadenopathy:    She has no cervical adenopathy.  Neurological: She is alert and oriented to person, place, and time.  Skin: Skin is warm and dry. No rash noted.  Psychiatric: She has a normal mood and affect. Her behavior is normal. Thought content normal.    BP 110/64 mmHg  Pulse 72  Ht  (1.575 m)  Wt 211 lb 3.2 oz (95.8 kg)  BMI 38.62 kg/m2  Assessment and Plan: 1. Gastric peptic ulcer, unspecified ulcer chronicity Continue pantoprazole; will check for H Pylori and treat if positive - H. pylori antibody, IgG  2. Eustachian tube dysfunction, left No infection or foreign body noted - recommend sudadfed 30 mg tid until relieved, continue flonase and zyrtec May need ENT referral.   Bari Edward, MD [facility]  08/16/2014

## 2014-08-17 LAB — H. PYLORI ANTIBODY, IGG

## 2014-08-25 ENCOUNTER — Other Ambulatory Visit: Payer: Self-pay | Admitting: Internal Medicine

## 2014-09-20 ENCOUNTER — Ambulatory Visit (INDEPENDENT_AMBULATORY_CARE_PROVIDER_SITE_OTHER): Payer: Medicare Other | Admitting: Internal Medicine

## 2014-09-20 ENCOUNTER — Encounter: Payer: Self-pay | Admitting: Internal Medicine

## 2014-09-20 VITALS — BP 112/64 | HR 68 | Ht 62.0 in | Wt 211.4 lb

## 2014-09-20 DIAGNOSIS — K259 Gastric ulcer, unspecified as acute or chronic, without hemorrhage or perforation: Secondary | ICD-10-CM | POA: Diagnosis not present

## 2014-09-20 DIAGNOSIS — E784 Other hyperlipidemia: Secondary | ICD-10-CM

## 2014-09-20 DIAGNOSIS — N3001 Acute cystitis with hematuria: Secondary | ICD-10-CM | POA: Diagnosis not present

## 2014-09-20 DIAGNOSIS — N289 Disorder of kidney and ureter, unspecified: Secondary | ICD-10-CM | POA: Diagnosis not present

## 2014-09-20 DIAGNOSIS — Z0000 Encounter for general adult medical examination without abnormal findings: Secondary | ICD-10-CM | POA: Diagnosis not present

## 2014-09-20 DIAGNOSIS — Z1239 Encounter for other screening for malignant neoplasm of breast: Secondary | ICD-10-CM | POA: Diagnosis not present

## 2014-09-20 DIAGNOSIS — F339 Major depressive disorder, recurrent, unspecified: Secondary | ICD-10-CM

## 2014-09-20 DIAGNOSIS — I6930 Unspecified sequelae of cerebral infarction: Secondary | ICD-10-CM | POA: Diagnosis not present

## 2014-09-20 DIAGNOSIS — E7849 Other hyperlipidemia: Secondary | ICD-10-CM

## 2014-09-20 LAB — POC URINALYSIS WITH MICROSCOPIC (NON AUTO)MANUAL RESULT
Bilirubin, UA: NEGATIVE
Crystals: 0
Epithelial cells, urine per micros: 2
Glucose, UA: NEGATIVE
Ketones, UA: NEGATIVE
Mucus, UA: 0
Nitrite, UA: NEGATIVE
PH UA: 5
Protein, UA: NEGATIVE
RBC: 2 M/uL — AB (ref 4.04–5.48)
Spec Grav, UA: 1.01
UROBILINOGEN UA: 0.2
WBC CASTS UA: 5

## 2014-09-20 MED ORDER — VESICARE 10 MG PO TABS: 10.0000 mg | ORAL_TABLET | Freq: Every day | ORAL | Status: DC

## 2014-09-20 MED ORDER — CLONAZEPAM 0.5 MG PO TABS: 0.5000 mg | ORAL_TABLET | Freq: Three times a day (TID) | ORAL | Status: DC | PRN

## 2014-09-20 MED ORDER — TRAMADOL HCL 50 MG PO TABS: 50.0000 mg | ORAL_TABLET | Freq: Four times a day (QID) | ORAL | Status: DC | PRN

## 2014-09-20 MED ORDER — CELECOXIB 200 MG PO CAPS: 200.0000 mg | ORAL_CAPSULE | Freq: Every day | ORAL | Status: DC

## 2014-09-20 MED ORDER — CIPROFLOXACIN HCL 250 MG PO TABS: 250.0000 mg | ORAL_TABLET | Freq: Two times a day (BID) | ORAL | Status: DC

## 2014-09-20 MED ORDER — GABAPENTIN 300 MG PO CAPS: 300.0000 mg | ORAL_CAPSULE | Freq: Three times a day (TID) | ORAL | Status: DC

## 2014-09-20 MED ORDER — ESTRADIOL 0.1 MG/GM VA CREA: 1.0000 g | TOPICAL_CREAM | VAGINAL | Status: DC

## 2014-09-20 MED ORDER — TRAZODONE HCL 100 MG PO TABS: 250.0000 mg | ORAL_TABLET | Freq: Every day | ORAL | Status: AC

## 2014-09-20 MED ORDER — CITALOPRAM HYDROBROMIDE 20 MG PO TABS: 20.0000 mg | ORAL_TABLET | Freq: Every day | ORAL | Status: DC

## 2014-09-20 MED ORDER — CLOPIDOGREL BISULFATE 75 MG PO TABS: 75.0000 mg | ORAL_TABLET | Freq: Every day | ORAL | Status: AC

## 2014-09-20 MED ORDER — PANTOPRAZOLE SODIUM 40 MG PO TBEC: 40.0000 mg | DELAYED_RELEASE_TABLET | Freq: Every day | ORAL | Status: AC

## 2014-09-20 MED ORDER — ROPINIROLE HCL 0.25 MG PO TABS: 0.2500 mg | ORAL_TABLET | Freq: Every day | ORAL | Status: AC

## 2014-09-20 MED ORDER — ROSUVASTATIN CALCIUM 20 MG PO TABS: 20.0000 mg | ORAL_TABLET | Freq: Every day | ORAL | Status: DC

## 2014-09-20 NOTE — Patient Instructions (Signed)
Breast Self-Awareness Practicing breast self-awareness may pick up problems early, prevent significant medical complications, and possibly save your life. By practicing breast self-awareness, you can become familiar with how your breasts look and feel and if your breasts are changing. This allows you to notice changes early. It can also offer you some reassurance that your breast health is good. One way to learn what is normal for your breasts and whether your breasts are changing is to do a breast self-exam. If you find a lump or something that was not present in the past, it is best to contact your caregiver right away. Other findings that should be evaluated by your caregiver include nipple discharge, especially if it is bloody; skin changes or reddening; areas where the skin seems to be pulled in (retracted); or new lumps and bumps. Breast pain is seldom associated with cancer (malignancy), but should also be evaluated by a caregiver. HOW TO PERFORM A BREAST SELF-EXAM The best time to examine your breasts is 5-7 days after your menstrual period is over. During menstruation, the breasts are lumpier, and it may be more difficult to pick up changes. If you do not menstruate, have reached menopause, or had your uterus removed (hysterectomy), you should examine your breasts at regular intervals, such as monthly. If you are breastfeeding, examine your breasts after a feeding or after using a breast pump. Breast implants do not decrease the risk for lumps or tumors, so continue to perform breast self-exams as recommended. Talk to your caregiver about how to determine the difference between the implant and breast tissue. Also, talk about the amount of pressure you should use during the exam. Over time, you will become more familiar with the variations of your breasts and more comfortable with the exam. A breast self-exam requires you to remove all your clothes above the waist. 1. Look at your breasts and nipples.  Stand in front of a mirror in a room with good lighting. With your hands on your hips, push your hands firmly downward. Look for a difference in shape, contour, and size from one breast to the other (asymmetry). Asymmetry includes puckers, dips, or bumps. Also, look for skin changes, such as reddened or scaly areas on the breasts. Look for nipple changes, such as discharge, dimpling, repositioning, or redness. 2. Carefully feel your breasts. This is best done either in the shower or tub while using soapy water or when flat on your back. Place the arm (on the side of the breast you are examining) above your head. Use the pads (not the fingertips) of your three middle fingers on your opposite hand to feel your breasts. Start in the underarm area and use  inch (2 cm) overlapping circles to feel your breast. Use 3 different levels of pressure (light, medium, and firm pressure) at each circle before moving to the next circle. The light pressure is needed to feel the tissue closest to the skin. The medium pressure will help to feel breast tissue a little deeper, while the firm pressure is needed to feel the tissue close to the ribs. Continue the overlapping circles, moving downward over the breast until you feel your ribs below your breast. Then, move one finger-width towards the center of the body. Continue to use the  inch (2 cm) overlapping circles to feel your breast as you move slowly up toward the collar bone (clavicle) near the base of the neck. Continue the up and down exam using all 3 pressures until you reach the   middle of the chest. Do this with each breast, carefully feeling for lumps or changes. 3.  Keep a written record with breast changes or normal findings for each breast. By writing this information down, you do not need to depend only on memory for size, tenderness, or location. Write down where you are in your menstrual cycle, if you are still menstruating. Breast tissue can have some lumps or  thick tissue. However, see your caregiver if you find anything that concerns you.  SEEK MEDICAL CARE IF:  You see a change in shape, contour, or size of your breasts or nipples.   You see skin changes, such as reddened or scaly areas on the breasts or nipples.   You have an unusual discharge from your nipples.   You feel a new lump or unusually thick areas.  Document Released: 02/25/2005 Document Revised: 02/12/2012 Document Reviewed: 06/12/2011 [facility] Patient Information 2015 [employer] [place]. This information is not intended to replace advice given to you by your health care Kreston Ahrendt. Make sure you discuss any questions you have with your health care Elray Dains.   Health Maintenance  Topic Date Due  . DEXA SCAN  05/26/2012  . PNA vac Low Risk Adult (1 of 2 - PCV13) 03/12/2013  . MAMMOGRAM  05/23/2014  . INFLUENZA VACCINE  10/10/2014  . COLONOSCOPY  03/13/2021  . TETANUS/TDAP  04/11/2022  . ZOSTAVAX  Completed

## 2014-09-20 NOTE — Progress Notes (Signed)
Patient: Hannah DammeGlenda Gunther, Female    DOB: 1946-03-07, 69 y.o.   MRN: [id number] Visit Date: 09/20/2014  Today's Provider: Bari EdwardLaura Lela Murfin, MD   Chief Complaint  Patient presents with  . Medicare Wellness  . Hyperlipidemia   Subjective:    Annual wellness visit Hannah DammeGlenda Flinchum is a 69 y.o. female who presents today for her Subsequent Annual Wellness Visit. She feels fairly well. She reports exercising some by working in the yard. She reports she is sleeping well. She denies any beast mass or pain.  She is due for annual mammogam.  ----------------------------------------------------------- Gastrophageal Reflux She complains of abdominal pain, belching and water brash. She reports no chest pain, no choking, no coughing or no sore throat. This is a recurrent problem. The problem occurs frequently. The problem has been gradually worsening. Nothing aggravates the symptoms. There are no known risk factors. The treatment provided moderate relief.  Mental Health Problem The primary symptoms do not include dysphoric mood, delusions or hallucinations. The current episode started more than 1 month ago. This is a chronic problem.  The degree of incapacity that she is experiencing as a consequence of her illness is mild. Additional symptoms of the illness include unexpected weight change and abdominal pain. Additional symptoms of the illness do not include no appetite change or no headaches. She does not admit to suicidal ideas. She does not have a plan to commit suicide.  She feels that she is doing well on her antidepressants.  She uses clonazepam occasionally.  Review of Systems  Constitutional: Positive for unexpected weight change. Negative for fever, activity change and appetite change.  HENT: Negative for hearing loss, sore throat, tinnitus and voice change.   Eyes: Negative for visual disturbance.  Respiratory: Negative for cough, choking and shortness of breath.   Cardiovascular: Negative for chest  pain, palpitations and leg swelling.  Gastrointestinal: Positive for abdominal pain. Negative for constipation and blood in stool.  Endocrine: Negative for polyuria.  Genitourinary: Positive for dysuria. Negative for urgency, hematuria and pelvic pain.  Musculoskeletal: Positive for back pain and arthralgias.  Neurological: Negative for syncope, numbness and headaches.  Hematological: Negative for adenopathy.  Psychiatric/Behavioral: Negative for hallucinations, sleep disturbance and dysphoric mood.    History   Social History  . Marital Status: Married    Spouse Name: N/A  . Number of Children: N/A  . Years of Education: N/A   Occupational History  . Not on file.   Social History Main Topics  . Smoking status: Never Smoker   . Smokeless tobacco: Not on file  . Alcohol Use: No  . Drug Use: No  . Sexual Activity: Not on file   Other Topics Concern  . Not on file   Social History Narrative    Patient Active Problem List   Diagnosis Date Noted  . Absence of bladder continence 08/16/2014  . Schizoaffective disorder, bipolar type 08/16/2014  . Allergic rhinitis 07/22/2014  . Atrophic vaginitis 07/22/2014  . Impaired renal function 07/22/2014  . Chronic constipation 07/22/2014  . Recurrent major depressive episodes 07/22/2014  . 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 Hematuria, microscopic 07/22/2014  . History of cerebrovascular accident with residual deficit 07/22/2014  . Familial multiple lipoprotein-type hyperlipidemia 07/22/2014  . Insomnia, persistent 07/22/2014  . Degenerative arthritis of lumbar spine 07/22/2014  . Generalized OA 07/22/2014  . Gastric peptic ulcer 07/22/2014  . Restless leg 07/22/2014  . Cerebrovascular accident, late effects 07/22/2014  . Lichen sclerosus 06/27/2014  . Complication of internal prosthetic device  06/27/2014  . Cirrhosis 11/30/2012    Past Surgical History  Procedure Laterality Date  . Colectomy  2003     non-malignant bowel obstruction  . Cholecystectomy    . Total abdominal hysterectomy    . Incontinence surgery    . Eye surgery Bilateral     Cataracts    Her family history includes Lung cancer in her father; Ovarian cancer in her maternal grandmother.    Previous Medications   BACLOFEN (LIORESAL) 10 MG TABLET    Take 0.5 tablets by mouth daily.   CELECOXIB (CELEBREX) 200 MG CAPSULE    Take 1 capsule by mouth daily.   CETIRIZINE (ZYRTEC) 10 MG TABLET    Take 1 tablet by mouth daily.   CITALOPRAM (CELEXA) 20 MG TABLET    Take 1 tablet by mouth daily.   CLOBETASOL OINTMENT (TEMOVATE) 0.05 %    Apply topically.   CLOBETASOL PROP EMOLLIENT BASE 0.05 % EMOLLIENT CREAM    CLOBETASOL PROPIONATE E, 0.05% (External Cream) - Historical Medication  two times daily (0.05 %) Active   CLONAZEPAM (KLONOPIN) 0.5 MG TABLET    TAKE ONE TABLET BY MOUTH THREE TIMES DAILY AS NEEDED FOR ANXIETY   CLOPIDOGREL (PLAVIX) 75 MG TABLET    Take 1 tablet by mouth daily.   ESTRADIOL (ESTRACE) 0.1 MG/GM VAGINAL CREAM    Place 1 g vaginally 2 (two) times a week.   FLAVOXATE (URISPAS) 100 MG TABLET    Take 100 mg by mouth.   FLUTICASONE (FLONASE) 50 MCG/ACT NASAL SPRAY    Place 2 sprays into the nose daily.   GABAPENTIN (NEURONTIN) 300 MG CAPSULE    Take 1 capsule by mouth 3 (three) times daily.   LACTULOSE (CHRONULAC) 10 GM/15ML SOLUTION    Take 30 mLs by mouth daily as needed.   PANTOPRAZOLE (PROTONIX) 40 MG TABLET    Take 1 tablet by mouth daily.   ROPINIROLE (REQUIP) 0.25 MG TABLET    Take 1 tablet by mouth at bedtime.   ROSUVASTATIN (CRESTOR) 20 MG TABLET    Take 1 tablet by mouth at bedtime.   TRAMADOL (ULTRAM) 50 MG TABLET    Take 1 tablet by mouth 4 (four) times daily as needed.   TRAZODONE (DESYREL) 100 MG TABLET    Take 2.5 tablets by mouth at bedtime.   VESICARE 10 MG TABLET        Patient Care Team: Reubin Milan, MD as PCP - General (Family Medicine)     Objective:   Vitals: BP 112/64 mmHg   Pulse 68  Ht  (1.575 m)  Wt 211 lb 6.4 oz (95.89 kg)  BMI 38.66 kg/m2  Physical Exam  Constitutional: She is oriented to person, place, and time. She appears well-developed and well-nourished. No distress.  HENT:  Right Ear: Tympanic membrane and ear canal normal.  Left Ear: Tympanic membrane and ear canal normal.  Nose: Right sinus exhibits no maxillary sinus tenderness. Left sinus exhibits no maxillary sinus tenderness.  Mouth/Throat: Uvula is midline and oropharynx is clear and moist.  Neck: Neck supple. No tracheal tenderness present. Carotid bruit is not present. No thyromegaly present.  Cardiovascular: Normal rate, regular rhythm, S1 normal and intact distal pulses.   Pulmonary/Chest: Effort normal and breath sounds normal. Right breast exhibits no nipple discharge, no skin change and no tenderness. Left breast exhibits no nipple discharge, no skin change and no tenderness.    Abdominal: Soft. Normal appearance and bowel sounds are normal. There is  no hepatosplenomegaly. There is tenderness in the epigastric area. There is no rebound.  Lymphadenopathy:    She has no cervical adenopathy.    She has no axillary adenopathy.  Neurological: She is alert and oriented to person, place, and time. No sensory deficit.  Skin: Skin is warm, dry and intact.  Psychiatric: She has a normal mood and affect. Her speech is normal. Cognition and memory are normal.    Activities of Daily Living In your present state of health, do you have any difficulty performing the following activities: 08/16/2014  Hearing? Y  Vision? N  Difficulty concentrating or making decisions? N  Walking or climbing stairs? N  Dressing or bathing? N  Doing errands, shopping? N    Fall Risk Assessment Fall Risk  08/16/2014  Falls in the past year? No     Patient reports there are safety devices in place in shower at home.   Depression Screen PHQ 2/9 Scores 08/16/2014  PHQ - 2 Score 2  PHQ- 9 Score 3     Cognitive Testing - 6-CIT   Correct? Score   What year is it? yes 0 Yes = 0    No = 4  What month is it? yes 0 Yes = 0    No = 3  Remember:     Floyde [address]ins, [address], [place]     What time is it? yes 0 Yes = 0    No = 3  Count backwards from 20 to 1 yes 0 Correct = 0    1 error = 2   More than 1 error = 4  Say the months of the year in reverse. yes 1 Correct = 0    1 error = 2   More than 1 error = 4  What address did I ask you to remember? yes 0 Correct = 0  1 error = 2    2 error = 4    3 error = 6    4 error = 8    All wrong = 10       TOTAL SCORE  1/28   Interpretation:  Normal  Normal (0-7) Abnormal (8-28)        Assessment & Plan:     Annual Wellness Visit  Reviewed patient's Family Medical History Reviewed and updated list of patient's medical providers Assessment of cognitive impairment was done Assessed patient's functional ability Established a written schedule for health screening services Health Risk Assessent Completed and Reviewed  Exercise Activities and Dietary recommendations Goals    None      Immunization History  Administered Date(s) Administered  . Pneumococcal Polysaccharide-23 03/12/2012  . Tdap 04/11/2012  . Zoster 11/09/2013    Health Maintenance  Topic Date Due  . DEXA SCAN  05/26/2012  . PNA vac Low Risk Adult (1 of 2 - PCV13) 03/12/2013  . MAMMOGRAM  05/23/2014  . INFLUENZA VACCINE  10/10/2014  . COLONOSCOPY  03/13/2021  . TETANUS/TDAP  04/11/2022  . ZOSTAVAX  Completed     Discussed health benefits of physical activity, and encouraged her to engage in regular exercise appropriate for her age and condition.    ------------------------------------------------------------------------------------------------------------ 1. Annual physical exam Evidence of UTI - POC urinalysis w microscopic (non auto)  2. Breast cancer screening Pt will schedule annual exam - MM DIGITAL SCREENING BILATERAL; Future  3. Gastric peptic  ulcer, unspecified ulcer chronicity Rule out H Pylori Consider pantoprazole bid - CBC with Differential/Platelet - H.  pylori antibody, IgG  4. Impaired renal function Continue avoid nsaids Use tramadol for pain - Comprehensive metabolic panel  5. Recurrent major depressive episodes Pt feels that she is doing well on current medication - no changes for now - clonazePAM (KLONOPIN) 0.5 MG tablet; Take 1 tablet (0.5 mg total) by mouth 3 (three) times daily as needed. for anxiety  Dispense: 90 tablet; Refill: 0 - TSH  6. Familial multiple lipoprotein-type hyperlipidemia Continue crestory - Lipid panel  7. History of cerebrovascular accident with residual deficit Minor subjective visual deficit - continue Plavix  8. Acute cystitis with hematuria - ciprofloxacin (CIPRO) 250 MG tablet; Take 1 tablet (250 mg total) by mouth 2 (two) times daily.  Dispense: 10 tablet; Refill: 0   Bari Edward, MD [facility]  09/20/2014

## 2014-09-21 LAB — COMPREHENSIVE METABOLIC PANEL
ALT: 16 IU/L (ref 0–32)
AST: 21 IU/L (ref 0–40)
Albumin/Globulin Ratio: 1.5 (ref 1.1–2.5)
Albumin: 4.3 g/dL (ref 3.6–4.8)
Alkaline Phosphatase: 47 IU/L (ref 39–117)
BUN/Creatinine Ratio: 10 — ABNORMAL LOW (ref 11–26)
BUN: 13 mg/dL (ref 8–27)
Bilirubin Total: <0.2 mg/dL (ref 0.0–1.2)
CALCIUM: 9.2 mg/dL (ref 8.7–10.3)
CO2: 25 mmol/L (ref 18–29)
Chloride: 103 mmol/L (ref 97–108)
Creatinine, Ser: 1.36 mg/dL — ABNORMAL HIGH (ref 0.57–1.00)
GFR calc Af Amer: 46 mL/min/{1.73_m2} — ABNORMAL LOW (ref >59)
GFR calc non Af Amer: 40 mL/min/{1.73_m2} — ABNORMAL LOW (ref >59)
Globulin, Total: 2.9 g/dL (ref 1.5–4.5)
Glucose: 116 mg/dL — ABNORMAL HIGH (ref 65–99)
POTASSIUM: 4.8 mmol/L (ref 3.5–5.2)
SODIUM: 144 mmol/L (ref 134–144)
TOTAL PROTEIN: 7.2 g/dL (ref 6.0–8.5)

## 2014-09-21 LAB — LIPID PANEL
Chol/HDL Ratio: 3.6 ratio units (ref 0.0–4.4)
Cholesterol, Total: 151 mg/dL (ref 100–199)
HDL: 42 mg/dL (ref >39)
LDL Calculated: 43 mg/dL (ref 0–99)
Triglycerides: 330 mg/dL — ABNORMAL HIGH (ref 0–149)
VLDL CHOLESTEROL CAL: 66 mg/dL — AB (ref 5–40)

## 2014-09-22 LAB — CBC WITH DIFFERENTIAL/PLATELET
BASOS ABS: 0 10*3/uL (ref 0.0–0.2)
BASOS: 0 %
EOS (ABSOLUTE): 0.1 10*3/uL (ref 0.0–0.4)
EOS: 2 %
HEMOGLOBIN: 12.8 g/dL (ref 11.1–15.9)
Hematocrit: 39.6 % (ref 34.0–46.6)
Immature Grans (Abs): 0 10*3/uL (ref 0.0–0.1)
Immature Granulocytes: 0 %
Lymphocytes Absolute: 1 10*3/uL (ref 0.7–3.1)
Lymphs: 19 %
MCH: 30.3 pg (ref 26.6–33.0)
MCHC: 32.3 g/dL (ref 31.5–35.7)
MCV: 94 fL (ref 79–97)
Monocytes Absolute: 0.3 10*3/uL (ref 0.1–0.9)
Monocytes: 7 %
NEUTROS ABS: 3.5 10*3/uL (ref 1.4–7.0)
NEUTROS PCT: 72 %
Platelets: 88 10*3/uL — CL (ref 150–379)
RBC: 4.23 x10E6/uL (ref 3.77–5.28)
RDW: 13.3 % (ref 12.3–15.4)
WBC: 5 10*3/uL (ref 3.4–10.8)

## 2014-09-22 LAB — TSH: TSH: 0.961 u[IU]/mL (ref 0.450–4.500)

## 2014-09-22 LAB — H. PYLORI ANTIBODY, IGG

## 2014-10-31 ENCOUNTER — Other Ambulatory Visit: Payer: Self-pay | Admitting: Internal Medicine

## 2014-11-16 ENCOUNTER — Other Ambulatory Visit: Payer: Self-pay | Admitting: Internal Medicine

## 2014-12-08 ENCOUNTER — Encounter: Payer: Self-pay | Admitting: *Deleted

## 2014-12-11 ENCOUNTER — Other Ambulatory Visit: Payer: Self-pay | Admitting: Internal Medicine

## 2014-12-13 ENCOUNTER — Encounter: Payer: Self-pay | Admitting: Urology

## 2014-12-13 ENCOUNTER — Ambulatory Visit: Payer: Medicare Other | Admitting: Urology

## 2015-02-11 DIAGNOSIS — R29898 Other symptoms and signs involving the musculoskeletal system: Secondary | ICD-10-CM | POA: Diagnosis not present

## 2015-02-11 DIAGNOSIS — R079 Chest pain, unspecified: Secondary | ICD-10-CM | POA: Diagnosis not present

## 2015-02-11 DIAGNOSIS — Z79899 Other long term (current) drug therapy: Secondary | ICD-10-CM | POA: Diagnosis not present

## 2015-02-11 DIAGNOSIS — I10 Essential (primary) hypertension: Secondary | ICD-10-CM | POA: Diagnosis not present

## 2015-02-11 DIAGNOSIS — Z885 Allergy status to narcotic agent status: Secondary | ICD-10-CM | POA: Diagnosis not present

## 2015-02-11 DIAGNOSIS — R2991 Unspecified symptoms and signs involving the musculoskeletal system: Secondary | ICD-10-CM | POA: Diagnosis not present

## 2015-02-11 DIAGNOSIS — R0789 Other chest pain: Secondary | ICD-10-CM | POA: Diagnosis not present

## 2015-02-11 DIAGNOSIS — K219 Gastro-esophageal reflux disease without esophagitis: Secondary | ICD-10-CM | POA: Diagnosis not present

## 2015-02-11 DIAGNOSIS — F319 Bipolar disorder, unspecified: Secondary | ICD-10-CM | POA: Diagnosis not present

## 2015-02-11 DIAGNOSIS — Z8673 Personal history of transient ischemic attack (TIA), and cerebral infarction without residual deficits: Secondary | ICD-10-CM | POA: Diagnosis not present

## 2015-02-11 DIAGNOSIS — E785 Hyperlipidemia, unspecified: Secondary | ICD-10-CM | POA: Diagnosis not present

## 2015-02-11 DIAGNOSIS — Z7902 Long term (current) use of antithrombotics/antiplatelets: Secondary | ICD-10-CM | POA: Diagnosis not present

## 2015-02-28 ENCOUNTER — Other Ambulatory Visit: Payer: Self-pay | Admitting: Internal Medicine

## 2015-03-09 NOTE — Telephone Encounter (Signed)
pts coming in on 1/18

## 2015-03-26 IMAGING — CT CT STONE STUDY
1 of 2 series · 14 of 32 positions shown, 18 images · non-contrast
Comparison: none

REASON FOR EXAM: Rt flank pain
COMMENTS:

[Series 2: 3mm soft tissue · axial · 0.86mm/px · z∈[-494,-80]mm · 14 of 152 slices shown, 18 images]
[im 7/152  soft-tissue]
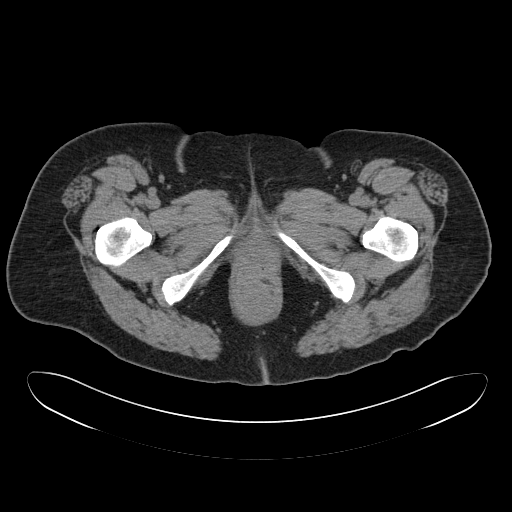
[im 7/152  bone]
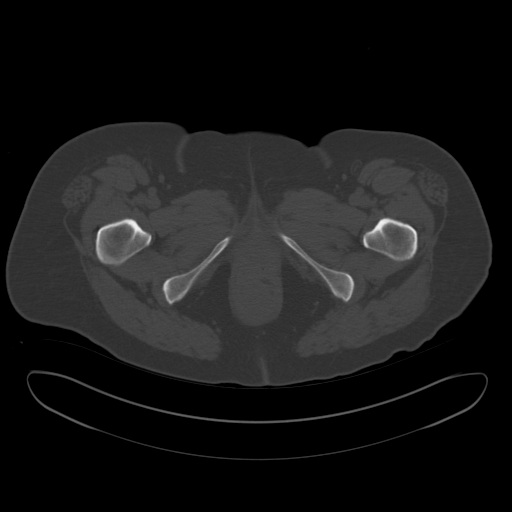
[im 20/152  soft-tissue]
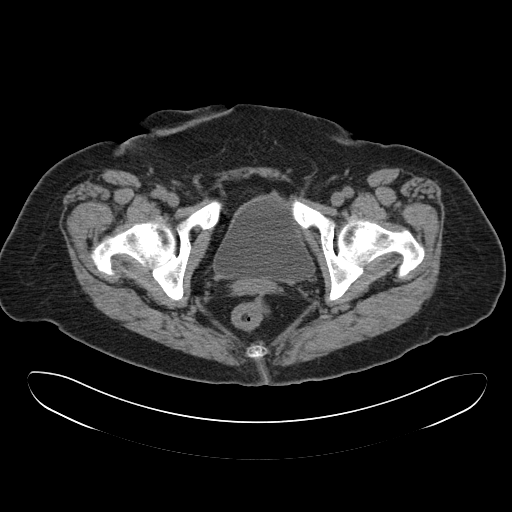
[im 33/152  soft-tissue]
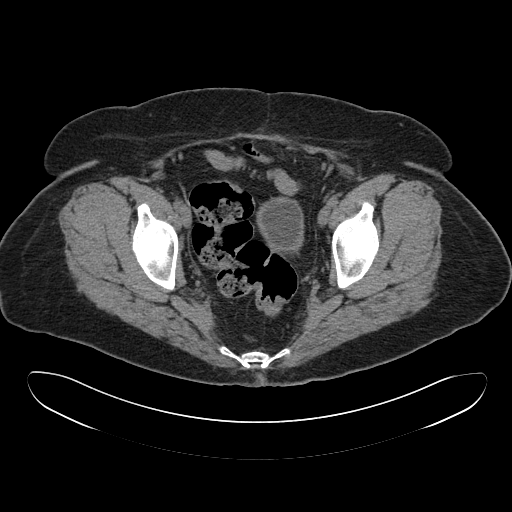
[im 46/152  soft-tissue]
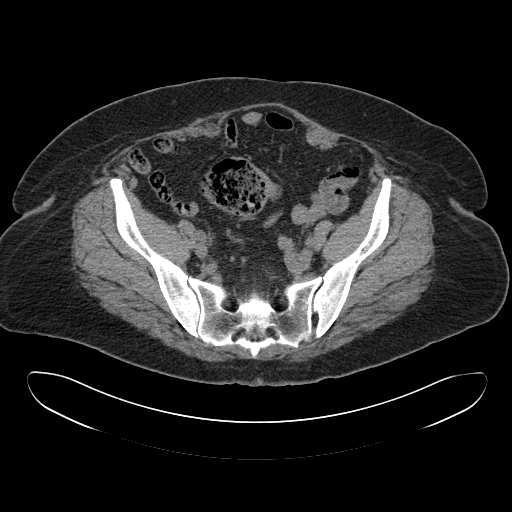
[im 60/152  soft-tissue]
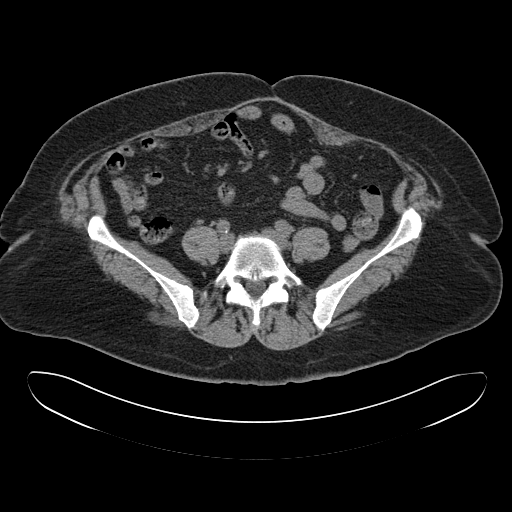
[im 73/152  soft-tissue]
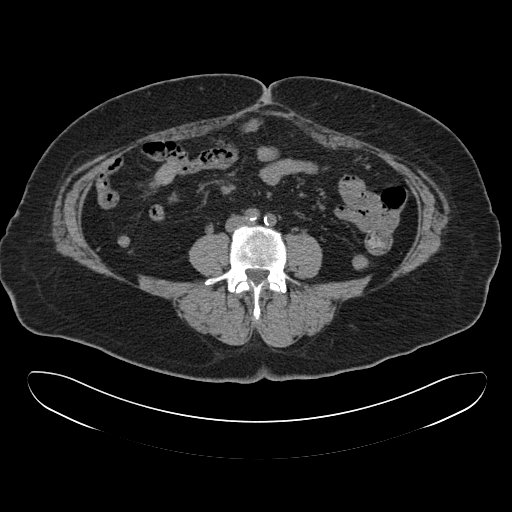
[im 79/152  soft-tissue]
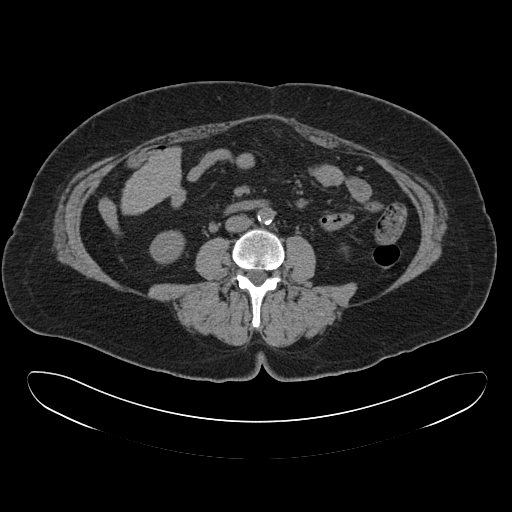
[im 92/152  soft-tissue]
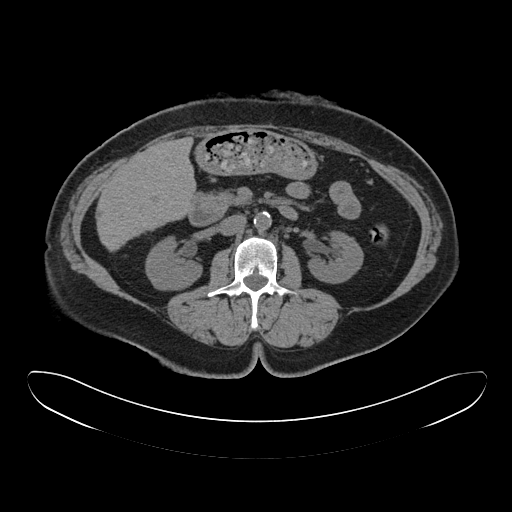
[im 106/152  soft-tissue]
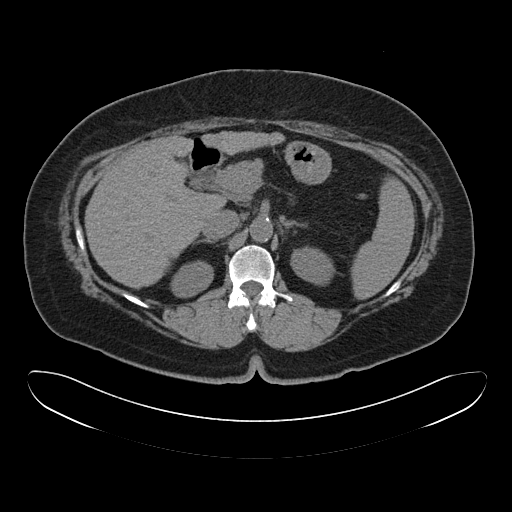
[im 106/152  bone]
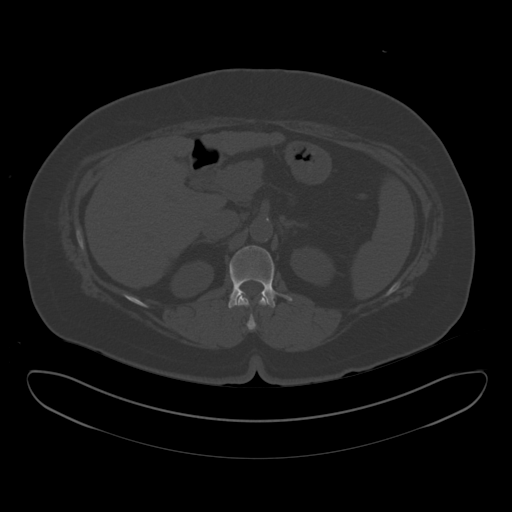
[im 119/152  soft-tissue]
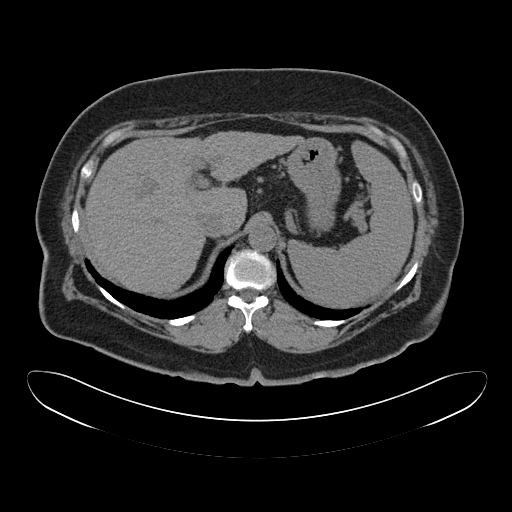
[im 125/152  lung]
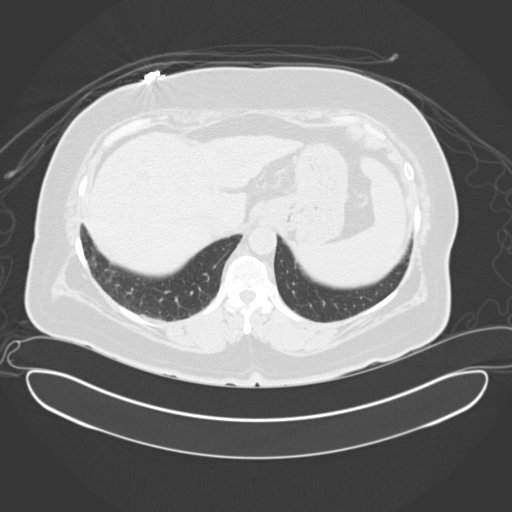
[im 132/152  soft-tissue]
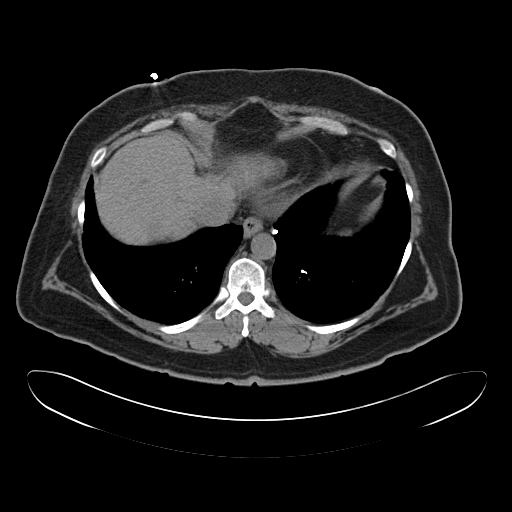
[im 132/152  lung]
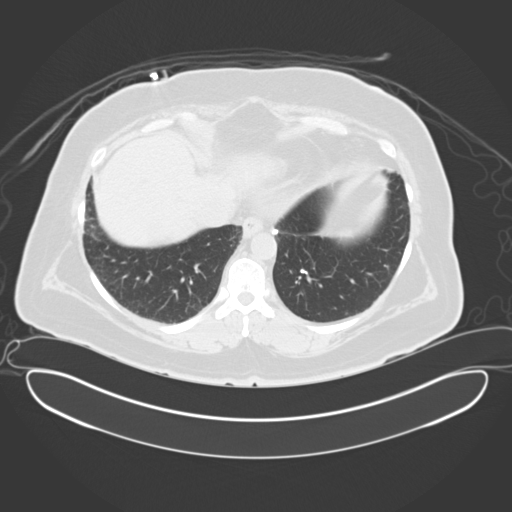
[im 138/152  lung]
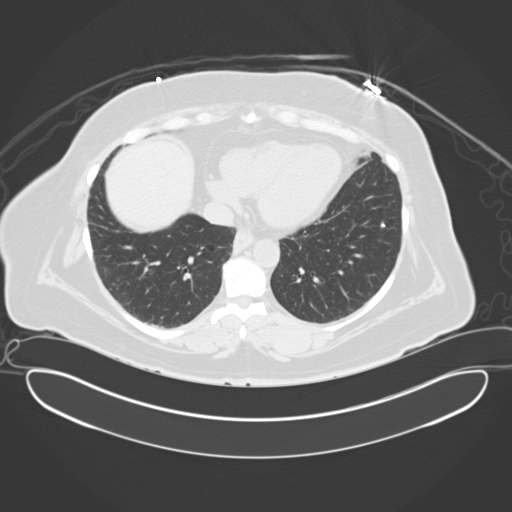
[im 145/152  soft-tissue]
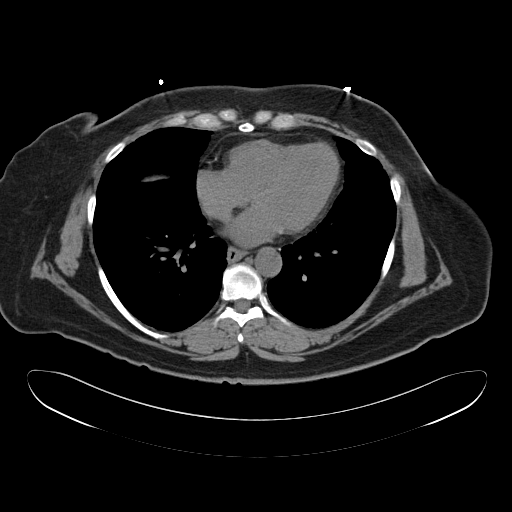
[im 145/152  lung]
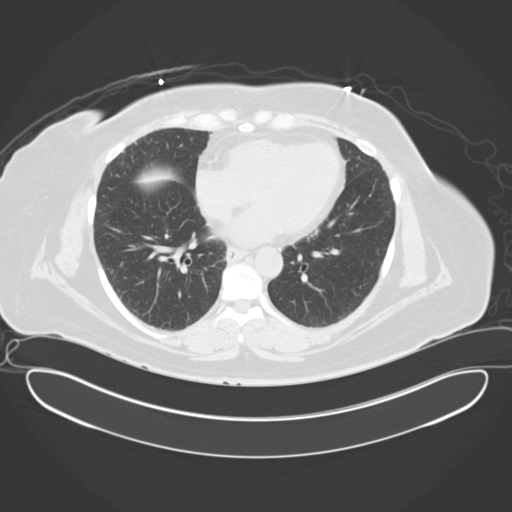

[14 of 32 positions shown; findings below may reference images not displayed]

PROCEDURE:     CT  - CT ABDOMEN /PELVIS WO (STONE)  - November 07, 2012 [DATE]

RESULT:     Axial noncontrast CT scanning was performed through the abdomen
and pelvis with reconstructions at 3 mm intervals and slice thicknesses.
Review of multiplanar reconstructed images was performed separately on the
VIA monitor.

The kidneys are normal in contour. No calcified stones are demonstrated
within either kidney. There are extrarenal pelves bilaterally. The
perinephric fat is normal in density. The partially distended urinary
bladder exhibits no acute abnormalities.

The gallbladder is surgically absent. The liver is normal in contour but
exhibits a somewhat nodular surface. Approximately 2 cm diameter focus of
hypodensity posteriorly in the upper aspect of the right hepatic lobe is
noted. There are associated calcifications. There is no intrahepatic ductal
dilation. The spleen is elongated and measures 14 cm AP by 10.3 cm
transversely by 10 cm in superior to inferior dimension. It exhibits normal
density.

The stomach is nondistended. There is a small hiatal hernia. The pancreas,
adrenal glands, the periaortic and pericaval regions appear normal. The
caliber of the abdominal aorta is normal.

The unopacified loops of small and large bowel exhibit no evidence of ileus
nor of obstruction. There is no inguinal hernia. There is laxity of the
rectus muscle in the midline but no classic hernia is demonstrated. There is
no evidence of ascites. There is no pelvic or inguinal lymphadenopathy.

The lung bases exhibit evidence of previous granulomatous infection
manifested by subcentimeter calcified nodules. There is no pleural effusion.
The lumbar vertebral bodies are preserved in height.
IMPRESSION: 1. The liver exhibits a somewhat nodular surface suggesting cirrhosis. There
is mild splenomegaly. There is a hypodensity posteriorly in the right
hepatic lobe with adjacent calcification which is a nonspecific finding.
Could be evaluated further with MRI.
2. There is no acute urinary tract abnormality nor acute bowel abnormality.
3. The lung bases exhibit evidence of previous granulomatous infection.

A preliminary report was sent to the [HOSPITAL] the conclusion
of the study.

[REDACTED]

## 2015-03-29 ENCOUNTER — Ambulatory Visit: Payer: Medicare Other | Admitting: Internal Medicine

## 2015-04-10 ENCOUNTER — Ambulatory Visit (INDEPENDENT_AMBULATORY_CARE_PROVIDER_SITE_OTHER): Payer: Medicare Other | Admitting: Internal Medicine

## 2015-04-10 ENCOUNTER — Encounter: Payer: Self-pay | Admitting: Internal Medicine

## 2015-04-10 VITALS — BP 122/80 | HR 74 | Ht 62.0 in | Wt 208.2 lb

## 2015-04-10 DIAGNOSIS — M47816 Spondylosis without myelopathy or radiculopathy, lumbar region: Secondary | ICD-10-CM

## 2015-04-10 DIAGNOSIS — E784 Other hyperlipidemia: Secondary | ICD-10-CM | POA: Diagnosis not present

## 2015-04-10 DIAGNOSIS — N289 Disorder of kidney and ureter, unspecified: Secondary | ICD-10-CM

## 2015-04-10 DIAGNOSIS — G4700 Insomnia, unspecified: Secondary | ICD-10-CM

## 2015-04-10 DIAGNOSIS — K5909 Other constipation: Secondary | ICD-10-CM

## 2015-04-10 DIAGNOSIS — F339 Major depressive disorder, recurrent, unspecified: Secondary | ICD-10-CM

## 2015-04-10 DIAGNOSIS — E7849 Other hyperlipidemia: Secondary | ICD-10-CM

## 2015-04-10 DIAGNOSIS — K5900 Constipation, unspecified: Secondary | ICD-10-CM | POA: Diagnosis not present

## 2015-04-10 MED ORDER — BUPROPION HCL ER (XL) 150 MG PO TB24: 150.0000 mg | ORAL_TABLET | Freq: Every day | ORAL | Status: AC

## 2015-04-10 MED ORDER — CLONAZEPAM 0.5 MG PO TABS: 0.5000 mg | ORAL_TABLET | Freq: Three times a day (TID) | ORAL | Status: AC | PRN

## 2015-04-10 MED ORDER — FLAVOXATE HCL 100 MG PO TABS: 100.0000 mg | ORAL_TABLET | Freq: Three times a day (TID) | ORAL | Status: AC | PRN

## 2015-04-10 MED ORDER — DULOXETINE HCL 60 MG PO CPEP: 60.0000 mg | ORAL_CAPSULE | Freq: Every day | ORAL | Status: AC

## 2015-04-10 MED ORDER — CITALOPRAM HYDROBROMIDE 20 MG PO TABS: 30.0000 mg | ORAL_TABLET | Freq: Every day | ORAL | Status: DC

## 2015-04-10 MED ORDER — TRAMADOL HCL 50 MG PO TABS: 50.0000 mg | ORAL_TABLET | Freq: Four times a day (QID) | ORAL | Status: AC | PRN

## 2015-04-10 NOTE — Progress Notes (Signed)
Date:  04/10/2015   Name:  Hannah Melendez   DOB:  [birth year]   MRN:  [id number]   Chief Complaint: Follow-up; Hyperlipidemia; Gastroesophageal Reflux; and Bowel Problem Hyperlipidemia This is a chronic problem. The current episode started more than 1 year ago. The problem is controlled. Recent lipid tests were reviewed and are normal. Pertinent negatives include no chest pain or shortness of breath.  Gastroesophageal Reflux She complains of belching, heartburn and nausea. She reports no chest pain or no coughing. This is a chronic problem. The current episode started more than 1 year ago. Associated symptoms include fatigue. She has tried a PPI for the symptoms. The treatment provided significant relief. Past procedures include H. pylori antibody titer (negative).  Anxiety Presents for follow-up visit. Onset was at an unknown time. The problem has been waxing and waning. Symptoms include confusion, decreased concentration, nausea and nervous/anxious behavior. Patient reports no chest pain, compulsions, feeling of choking, palpitations or shortness of breath. Symptoms occur constantly. The severity of symptoms is interfering with daily activities. The symptoms are aggravated by family issues and social activities. The quality of sleep is fair.   Her past medical history is significant for anxiety/panic attacks, bipolar disorder and depression. Past treatments include benzodiazephines, SSRIs, non-SSRI antidepressants and non-benzodiazephine anxiolytics. The treatment provided moderate (Patient states she is having more stress in her life taking care of multiple sick family members and request an increased dose in her clonazepam) relief. Compliance with prior treatments has been variable.  Back Pain This is a chronic problem. The problem occurs daily. The problem is unchanged. Pertinent negatives include no bladder incontinence, bowel incontinence, chest pain, headaches or numbness. Treatments tried:  tramadol - patient's been taking 50 mg 4 times a day but states 50 mg is not strong enough and often has to take 100 mg.   Constipation - onset three weeks ago.  Previously had normal stools every day.  She is also having belching and bloating.  She has taken Lactulose several times to achieve a bowel movement. She denies any recent change in medication or activity but on closer questioning he has been back and forth to the hospital taking care of multiple sick family members. She reports having a colon resection in 2003 removing all of her colon except for 12 inches. She also had her gallbladder removed but not her appendix. She feels as she did when she had that bowel obstruction.   Review of Systems  Constitutional: Positive for fatigue. Negative for chills, diaphoresis and unexpected weight change.  Respiratory: Negative for cough, chest tightness and shortness of breath.   Cardiovascular: Negative for chest pain, palpitations and leg swelling.  Gastrointestinal: Positive for heartburn, nausea, constipation and abdominal distention. Negative for vomiting, diarrhea, blood in stool and bowel incontinence.  Genitourinary: Negative for bladder incontinence and difficulty urinating.  Skin: Negative for rash.  Neurological: Negative for syncope, numbness and headaches.  Psychiatric/Behavioral: Positive for confusion, sleep disturbance and decreased concentration. The patient is nervous/anxious.     Patient Active Problem List   Diagnosis Date Noted  . Absence of bladder continence 08/16/2014  . Schizoaffective disorder, bipolar type (HCC) 08/16/2014  . Allergic rhinitis 07/22/2014  . Atrophic vaginitis 07/22/2014  . Impaired renal function 07/22/2014  . Chronic constipation 07/22/2014  . Recurrent major depressive episodes (HCC) 07/22/2014  . 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 Hematuria, microscopic 07/22/2014  . History of cerebrovascular accident with residual deficit 07/22/2014   . Familial multiple lipoprotein-type hyperlipidemia 07/22/2014  .  Insomnia, persistent 07/22/2014  . Degenerative arthritis of lumbar spine 07/22/2014  . Generalized OA 07/22/2014  . Gastric peptic ulcer 07/22/2014  . Restless leg 07/22/2014  . Cerebrovascular accident, late effects 07/22/2014  . Lichen sclerosus 06/27/2014  . Complication of internal prosthetic device 06/27/2014  . Cirrhosis (HCC) 11/30/2012    Prior to Admission medications   Medication Sig Start Date End Date Taking? Authorizing Provider  baclofen (LIORESAL) 10 MG tablet Take 0.5 tablets by mouth daily. 05/25/14 05/25/15 Yes Historical Provider, MD  buPROPion (WELLBUTRIN XL) 150 MG 24 hr tablet TAKE ONE TABLET BY MOUTH EVERY 24 HOURS, DAILY 02/28/15  Yes Reubin Milan, MD  celecoxib (CELEBREX) 200 MG capsule Take 1 capsule (200 mg total) by mouth daily. 09/20/14  Yes Reubin Milan, MD  cetirizine (ZYRTEC) 10 MG tablet Take 1 tablet by mouth daily. 07/20/14  Yes Historical Provider, MD  citalopram (CELEXA) 20 MG tablet Take 1 tablet (20 mg total) by mouth daily. 09/20/14  Yes Reubin Milan, MD  clobetasol ointment (TEMOVATE) 0.05 % Apply topically.   Yes Historical Provider, MD  Clobetasol Prop Emollient Base 0.05 % emollient cream CLOBETASOL PROPIONATE E, 0.05% (External Cream) - Historical Medication  two times daily (0.05 %) Active   Yes Historical Provider, MD  clonazePAM (KLONOPIN) 0.5 MG tablet TAKE ONE TABLET BY MOUTH THREE TIMES DAILY AS NEEDED FOR ANXIETY 12/12/14  Yes Reubin Milan, MD  clopidogrel (PLAVIX) 75 MG tablet Take 1 tablet (75 mg total) by mouth daily. 09/20/14  Yes Reubin Milan, MD  DULoxetine (CYMBALTA) 60 MG capsule TAKE ONE CAPSULE BY MOUTH ONCE DAILY 02/28/15  Yes Reubin Milan, MD  estradiol (ESTRACE) 0.1 MG/GM vaginal cream Place 0.25 Applicatorfuls vaginally 2 (two) times a week. 09/20/14  Yes Reubin Milan, MD  flavoxATE (URISPAS) 100 MG tablet Take 100 mg by mouth. 10/09/13  Yes  Historical Provider, MD  fluticasone Aleda Grana) 50 MCG/ACT nasal spray Use 2 sprays in each  nostril once daily 11/17/14  Yes Reubin Milan, MD  gabapentin (NEURONTIN) 300 MG capsule Take 1 capsule (300 mg total) by mouth 3 (three) times daily. 09/20/14  Yes Reubin Milan, MD  lactulose (CHRONULAC) 10 GM/15ML solution Take 30 mLs by mouth daily as needed. 06/20/14  Yes Historical Provider, MD  pantoprazole (PROTONIX) 40 MG tablet Take 1 tablet (40 mg total) by mouth daily. 09/20/14  Yes Reubin Milan, MD  rOPINIRole (REQUIP) 0.25 MG tablet Take 1 tablet (0.25 mg total) by mouth at bedtime. 09/20/14  Yes Reubin Milan, MD  rosuvastatin (CRESTOR) 20 MG tablet Take 1 tablet (20 mg total) by mouth at bedtime. 09/20/14  Yes Reubin Milan, MD  traMADol (ULTRAM) 50 MG tablet Take 1 tablet (50 mg total) by mouth 4 (four) times daily as needed. 09/20/14  Yes Reubin Milan, MD  traZODone (DESYREL) 100 MG tablet Take 2.5 tablets (250 mg total) by mouth at bedtime. 09/20/14  Yes Reubin Milan, MD  VESICARE 10 MG tablet Take 1 tablet (10 mg total) by mouth daily. 09/20/14  Yes Reubin Milan, MD    Allergies  Allergen Reactions  . Oxycodone Anxiety and Hives  . Propoxyphene Hives and Itching  . Codeine     hallucinations  . Macrobid [Nitrofurantoin Monohyd Macro]     Nausea, Cough, Dizziness  . Morphine     Other reaction(s): Hallucination Other reaction(s): OTHER  . Nsaids     Other reaction(s): Other (See Comments)  H/o ulcers    Past Surgical History  Procedure Laterality Date  . Colectomy  2003    non-malignant bowel obstruction  . Cholecystectomy    . Total abdominal hysterectomy    . Incontinence surgery      bladder sling x 2  . Eye surgery Bilateral     Cataracts  . Cesarean section      Social History  Substance Use Topics  . Smoking status: Never Smoker   . Smokeless tobacco: None  . Alcohol Use: No     Medication list has been reviewed and  updated.   Physical Exam  Constitutional: She is oriented to person, place, and time. She appears well-developed and well-nourished. No distress.  Neck: Neck supple. No thyromegaly present.  Cardiovascular: Normal rate, regular rhythm and normal heart sounds.   Pulmonary/Chest: Effort normal and breath sounds normal.  Abdominal: Soft. Bowel sounds are normal. She exhibits no distension and no mass. There is no tenderness. There is no rebound and no guarding.  Musculoskeletal: She exhibits no edema or tenderness.  Lymphadenopathy:    She has no cervical adenopathy.  Neurological: She is alert and oriented to person, place, and time.  Skin: Skin is warm and dry.  Psychiatric: Her mood appears anxious (and somewhat confused). She is not aggressive and not actively hallucinating. She is inattentive.  Nursing note and vitals reviewed.   BP 122/80 mmHg  Pulse 74  Ht  (1.575 m)  Wt 208 lb 3.2 oz (94.439 kg)  BMI 38.07 kg/m2  Assessment and Plan: 1. Chronic constipation Take lactulose 2 tsp daily - adjust to a normal daily bowel movement No evidence of obstruction but recommend KUB but patient declined  2. Insomnia, persistent Continue trazodone  3. Spondylosis of lumbar region without myelopathy or radiculopathy Make take tramadol 100 mg per dose but no more than 200 mg per day - traMADol (ULTRAM) 50 MG tablet; Take 1 tablet (50 mg total) by mouth 4 (four) times daily as needed.  Dispense: 120 tablet; Refill: 5  4. Impaired renal function Waiting for Nephrology evaluation  5. Familial multiple lipoprotein-type hyperlipidemia On statin therapy  6. Recurrent major depressive episodes (HCC) Declined request for increase in Clonazepam Will increase celexa to 30 mg per day Consider Psych referral - buPROPion (WELLBUTRIN XL) 150 MG 24 hr tablet; Take 1 tablet (150 mg total) by mouth daily.  Dispense: 90 tablet; Refill: 1 - citalopram (CELEXA) 20 MG tablet; Take 1.5 tablets (30  mg total) by mouth daily.  Dispense: 145 tablet; Refill: 1 - DULoxetine (CYMBALTA) 60 MG capsule; Take 1 capsule (60 mg total) by mouth daily.  Dispense: 90 capsule; Refill: 1 - clonazePAM (KLONOPIN) 0.5 MG tablet; Take 1 tablet (0.5 mg total) by mouth 3 (three) times daily as needed. for anxiety  Dispense: 90 tablet; Refill: 5   Bari Edward, MD [facility]  04/10/2015

## 2015-04-11 ENCOUNTER — Other Ambulatory Visit: Payer: Self-pay | Admitting: Internal Medicine

## 2015-04-11 DIAGNOSIS — D696 Thrombocytopenia, unspecified: Secondary | ICD-10-CM | POA: Diagnosis not present

## 2015-04-11 DIAGNOSIS — Z9049 Acquired absence of other specified parts of digestive tract: Secondary | ICD-10-CM | POA: Diagnosis not present

## 2015-04-11 DIAGNOSIS — E7800 Pure hypercholesterolemia, unspecified: Secondary | ICD-10-CM | POA: Diagnosis not present

## 2015-04-11 DIAGNOSIS — Z8673 Personal history of transient ischemic attack (TIA), and cerebral infarction without residual deficits: Secondary | ICD-10-CM | POA: Diagnosis not present

## 2015-04-11 DIAGNOSIS — K7460 Unspecified cirrhosis of liver: Secondary | ICD-10-CM | POA: Diagnosis not present

## 2015-04-11 DIAGNOSIS — I10 Essential (primary) hypertension: Secondary | ICD-10-CM | POA: Diagnosis not present

## 2015-04-11 DIAGNOSIS — N182 Chronic kidney disease, stage 2 (mild): Secondary | ICD-10-CM | POA: Diagnosis not present

## 2015-04-12 ENCOUNTER — Ambulatory Visit: Payer: Medicare Other | Admitting: Internal Medicine

## 2015-04-21 DIAGNOSIS — I10 Essential (primary) hypertension: Secondary | ICD-10-CM | POA: Diagnosis not present

## 2015-04-21 DIAGNOSIS — K7460 Unspecified cirrhosis of liver: Secondary | ICD-10-CM | POA: Diagnosis not present

## 2015-04-21 DIAGNOSIS — R001 Bradycardia, unspecified: Secondary | ICD-10-CM | POA: Diagnosis not present

## 2015-04-21 DIAGNOSIS — Z8673 Personal history of transient ischemic attack (TIA), and cerebral infarction without residual deficits: Secondary | ICD-10-CM | POA: Diagnosis not present

## 2015-04-21 DIAGNOSIS — R161 Splenomegaly, not elsewhere classified: Secondary | ICD-10-CM | POA: Diagnosis not present

## 2015-04-21 DIAGNOSIS — J9811 Atelectasis: Secondary | ICD-10-CM | POA: Diagnosis not present

## 2015-04-21 DIAGNOSIS — K769 Liver disease, unspecified: Secondary | ICD-10-CM | POA: Diagnosis not present

## 2015-04-21 DIAGNOSIS — N2889 Other specified disorders of kidney and ureter: Secondary | ICD-10-CM | POA: Diagnosis not present

## 2015-04-21 DIAGNOSIS — R9089 Other abnormal findings on diagnostic imaging of central nervous system: Secondary | ICD-10-CM | POA: Diagnosis not present

## 2015-04-21 DIAGNOSIS — Z79899 Other long term (current) drug therapy: Secondary | ICD-10-CM | POA: Diagnosis not present

## 2015-04-21 DIAGNOSIS — E785 Hyperlipidemia, unspecified: Secondary | ICD-10-CM | POA: Diagnosis not present

## 2015-04-21 DIAGNOSIS — Z885 Allergy status to narcotic agent status: Secondary | ICD-10-CM | POA: Diagnosis not present

## 2015-04-21 DIAGNOSIS — R4182 Altered mental status, unspecified: Secondary | ICD-10-CM | POA: Diagnosis not present

## 2015-04-21 DIAGNOSIS — N289 Disorder of kidney and ureter, unspecified: Secondary | ICD-10-CM | POA: Diagnosis not present

## 2015-04-21 DIAGNOSIS — F319 Bipolar disorder, unspecified: Secondary | ICD-10-CM | POA: Diagnosis not present

## 2015-04-21 DIAGNOSIS — R109 Unspecified abdominal pain: Secondary | ICD-10-CM | POA: Diagnosis not present

## 2015-04-21 DIAGNOSIS — J90 Pleural effusion, not elsewhere classified: Secondary | ICD-10-CM | POA: Diagnosis not present

## 2015-04-21 DIAGNOSIS — K219 Gastro-esophageal reflux disease without esophagitis: Secondary | ICD-10-CM | POA: Diagnosis not present

## 2015-05-02 DIAGNOSIS — Z8673 Personal history of transient ischemic attack (TIA), and cerebral infarction without residual deficits: Secondary | ICD-10-CM | POA: Diagnosis not present

## 2015-05-02 DIAGNOSIS — Z9181 History of falling: Secondary | ICD-10-CM | POA: Diagnosis not present

## 2015-05-02 DIAGNOSIS — R400 Somnolence: Secondary | ICD-10-CM | POA: Diagnosis not present

## 2015-05-02 DIAGNOSIS — Z79899 Other long term (current) drug therapy: Secondary | ICD-10-CM | POA: Diagnosis not present

## 2015-05-06 ENCOUNTER — Other Ambulatory Visit: Payer: Self-pay | Admitting: Urology

## 2015-05-06 DIAGNOSIS — I129 Hypertensive chronic kidney disease with stage 1 through stage 4 chronic kidney disease, or unspecified chronic kidney disease: Secondary | ICD-10-CM | POA: Diagnosis not present

## 2015-05-06 DIAGNOSIS — Z9181 History of falling: Secondary | ICD-10-CM | POA: Diagnosis not present

## 2015-05-06 DIAGNOSIS — N182 Chronic kidney disease, stage 2 (mild): Secondary | ICD-10-CM | POA: Diagnosis not present

## 2015-05-06 DIAGNOSIS — I69398 Other sequelae of cerebral infarction: Secondary | ICD-10-CM | POA: Diagnosis not present

## 2015-05-06 DIAGNOSIS — I69328 Other speech and language deficits following cerebral infarction: Secondary | ICD-10-CM | POA: Diagnosis not present

## 2015-05-06 DIAGNOSIS — R400 Somnolence: Secondary | ICD-10-CM | POA: Diagnosis not present

## 2015-07-03 ENCOUNTER — Other Ambulatory Visit: Payer: Self-pay | Admitting: Internal Medicine

## 2015-07-03 ENCOUNTER — Other Ambulatory Visit: Payer: Self-pay | Admitting: Urology

## 2015-07-12 ENCOUNTER — Other Ambulatory Visit: Payer: Self-pay | Admitting: Internal Medicine

## 2015-07-23 ENCOUNTER — Other Ambulatory Visit: Payer: Self-pay | Admitting: Internal Medicine

## 2015-08-08 NOTE — Telephone Encounter (Signed)
Tried contacting patient on 4 separate occasions and left voicemail to return my phone call. No return phone calls and was unable to leave a voicemail on 4th attempt.

## 2015-08-11 ENCOUNTER — Other Ambulatory Visit: Payer: Self-pay | Admitting: Urology

## 2015-08-11 ENCOUNTER — Other Ambulatory Visit: Payer: Self-pay | Admitting: Internal Medicine

## 2015-08-14 ENCOUNTER — Other Ambulatory Visit: Payer: Self-pay | Admitting: Internal Medicine

## 2015-08-17 NOTE — Telephone Encounter (Signed)
S/w pt said she no longer comes here

## 2015-09-19 ENCOUNTER — Telehealth: Payer: Self-pay

## 2015-09-19 NOTE — Telephone Encounter (Signed)
[facility] called and said pt went by and asked for Clonaz. And after they looked up on registry they called to see why refills we wrote were D/C. Advised we found out she was seeing different PCP and D/C all Rx.  Patient has seen them since Feb for acute only visits but they did mention she had POS Toxicology report.
